# Patient Record
Sex: Male | Born: 1950 | Hispanic: No | Marital: Married | Smoking: Never smoker
Health system: Southern US, Community
[De-identification: ages and names within clinical notes are randomized; demographics above are authoritative.]

## PROBLEM LIST (undated history)

## (undated) DIAGNOSIS — C679 Malignant neoplasm of bladder, unspecified: Secondary | ICD-10-CM

## (undated) DIAGNOSIS — I10 Essential (primary) hypertension: Secondary | ICD-10-CM

## (undated) DIAGNOSIS — G4733 Obstructive sleep apnea (adult) (pediatric): Secondary | ICD-10-CM

## (undated) DIAGNOSIS — E785 Hyperlipidemia, unspecified: Secondary | ICD-10-CM

## (undated) HISTORY — DX: Essential (primary) hypertension: I10

## (undated) HISTORY — PX: BLADDER SURGERY: SHX569

## (undated) HISTORY — DX: Obstructive sleep apnea (adult) (pediatric): G47.33

## (undated) HISTORY — DX: Hyperlipidemia, unspecified: E78.5

## (undated) HISTORY — DX: Malignant neoplasm of bladder, unspecified: C67.9

---

## 2009-09-21 HISTORY — PX: COLONOSCOPY: SHX174

## 2011-11-03 ENCOUNTER — Encounter: Payer: Self-pay | Admitting: Family Medicine

## 2011-11-03 ENCOUNTER — Ambulatory Visit (INDEPENDENT_AMBULATORY_CARE_PROVIDER_SITE_OTHER): Payer: BC Managed Care – PPO | Admitting: Family Medicine

## 2011-11-03 VITALS — BP 118/72 | HR 90 | Temp 98.2°F | Ht 66.5 in | Wt 212.0 lb

## 2011-11-03 DIAGNOSIS — I10 Essential (primary) hypertension: Secondary | ICD-10-CM

## 2011-11-03 DIAGNOSIS — J45909 Unspecified asthma, uncomplicated: Secondary | ICD-10-CM

## 2011-11-03 DIAGNOSIS — C679 Malignant neoplasm of bladder, unspecified: Secondary | ICD-10-CM

## 2011-11-03 MED ORDER — ALBUTEROL SULFATE (2.5 MG/3ML) 0.083% IN NEBU: 2.5000 mg | INHALATION_SOLUTION | RESPIRATORY_TRACT | Status: DC | PRN

## 2011-11-03 MED ORDER — ALBUTEROL SULFATE HFA 108 (90 BASE) MCG/ACT IN AERS: 2.0000 | INHALATION_SPRAY | RESPIRATORY_TRACT | Status: DC | PRN

## 2011-11-03 MED ORDER — LISINOPRIL 5 MG PO TABS: 5.0000 mg | ORAL_TABLET | Freq: Every day | ORAL | Status: DC

## 2011-11-03 NOTE — Progress Notes (Signed)
  Subjective:    Patient ID: Dennis Wade, male    DOB: 1950-11-01, 61 y.o.   MRN: [id number]  HPI 61 yr old male to establish with [place]. He and his wife moved here from [place], [place] last September. He feels well with no concerns today. His asthma is well controlled, and he seldom needs to use his rescue inhaler. He does use this before exercise at times, especially in hot humid weather. He is a runner and actually completed a half marathon in [place] last year. He plans to run a full marathon this coming November. His BP is stable. He has not had a cpx for about 3 years.    Review of Systems  Constitutional: Negative.   Respiratory: Negative.   Cardiovascular: Negative.   Gastrointestinal: Negative.        Objective:   Physical Exam  Constitutional: He appears well-developed and well-nourished.  Neck: No thyromegaly present.  Cardiovascular: Normal rate, regular rhythm, normal heart sounds and intact distal pulses.   Pulmonary/Chest: Effort normal. He has no wheezes.  Musculoskeletal: He exhibits no edema.  Lymphadenopathy:    He has no cervical adenopathy.          Assessment & Plan:  Introductory visit for this man. HTN is stable. Asthma is stable. We will refer him to Urology to follow up his bladder cancer. He will set up a cpx with labs soon. He needs a colonoscopy.

## 2011-11-10 ENCOUNTER — Telehealth: Payer: Self-pay | Admitting: Family Medicine

## 2011-11-10 NOTE — Telephone Encounter (Signed)
Patient called stating that he would like to have some patches that he puts behind his ear called into [facility] on [address] as he is going on a cruise. Please advise and inform patient.

## 2011-11-10 NOTE — Telephone Encounter (Signed)
Call in Scopolamine transderm patches, apply every 3 days, #10

## 2011-11-11 MED ORDER — SCOPOLAMINE 1 MG/3DAYS TD PT72: 1.0000 | MEDICATED_PATCH | TRANSDERMAL | Status: AC

## 2011-11-11 NOTE — Telephone Encounter (Signed)
Script sent e-scribe and left voice message for pt. 

## 2011-12-08 ENCOUNTER — Other Ambulatory Visit: Payer: BC Managed Care – PPO

## 2011-12-15 ENCOUNTER — Encounter: Payer: BC Managed Care – PPO | Admitting: Family Medicine

## 2011-12-29 ENCOUNTER — Other Ambulatory Visit (INDEPENDENT_AMBULATORY_CARE_PROVIDER_SITE_OTHER): Payer: BC Managed Care – PPO

## 2011-12-29 DIAGNOSIS — Z0000 Encounter for general adult medical examination without abnormal findings: Secondary | ICD-10-CM

## 2011-12-29 LAB — BASIC METABOLIC PANEL
Chloride: 104 mEq/L (ref 96–112)
Glucose, Bld: 96 mg/dL (ref 70–99)
Potassium: 4.7 mEq/L (ref 3.5–5.1)
Sodium: 140 mEq/L (ref 135–145)

## 2011-12-29 LAB — HEPATIC FUNCTION PANEL
ALT: 27 U/L (ref 0–53)
AST: 26 U/L (ref 0–37)
Total Protein: 6.8 g/dL (ref 6.0–8.3)

## 2011-12-29 LAB — CBC WITH DIFFERENTIAL/PLATELET
Basophils Relative: 0.3 % (ref 0.0–3.0)
Eosinophils Relative: 1.5 % (ref 0.0–5.0)
HCT: 42.6 % (ref 39.0–52.0)
Hemoglobin: 14.4 g/dL (ref 13.0–17.0)
Lymphs Abs: 2.6 10*3/uL (ref 0.7–4.0)
MCV: 91.2 fl (ref 78.0–100.0)
Monocytes Absolute: 0.6 10*3/uL (ref 0.1–1.0)
RBC: 4.67 Mil/uL (ref 4.22–5.81)
WBC: 7 10*3/uL (ref 4.5–10.5)

## 2011-12-29 LAB — POCT URINALYSIS DIPSTICK
Bilirubin, UA: NEGATIVE
Glucose, UA: NEGATIVE
Ketones, UA: NEGATIVE
Nitrite, UA: NEGATIVE
Spec Grav, UA: 1.025
pH, UA: 6

## 2011-12-29 LAB — LIPID PANEL
Cholesterol: 214 mg/dL — ABNORMAL HIGH (ref 0–200)
Triglycerides: 92 mg/dL (ref 0.0–149.0)

## 2011-12-29 LAB — PSA: PSA: 0.46 ng/mL (ref 0.10–4.00)

## 2011-12-29 LAB — TSH: TSH: 1.46 u[IU]/mL (ref 0.35–5.50)

## 2012-01-04 NOTE — Progress Notes (Signed)
Quick Note:  Spoke with pt ______ 

## 2012-01-05 ENCOUNTER — Encounter: Payer: Self-pay | Admitting: Family Medicine

## 2012-01-05 ENCOUNTER — Ambulatory Visit (INDEPENDENT_AMBULATORY_CARE_PROVIDER_SITE_OTHER): Payer: BC Managed Care – PPO | Admitting: Family Medicine

## 2012-01-05 VITALS — BP 118/80 | HR 74 | Temp 98.5°F | Ht 66.0 in | Wt 214.0 lb

## 2012-01-05 DIAGNOSIS — R9431 Abnormal electrocardiogram [ECG] [EKG]: Secondary | ICD-10-CM

## 2012-01-05 DIAGNOSIS — Z0000 Encounter for general adult medical examination without abnormal findings: Secondary | ICD-10-CM

## 2012-01-05 MED ORDER — ROSUVASTATIN CALCIUM 20 MG PO TABS: 20.0000 mg | ORAL_TABLET | Freq: Every day | ORAL | Status: DC

## 2012-01-05 NOTE — Progress Notes (Signed)
  Subjective:    Patient ID: Dennis Wade, male    DOB: [birth year], 61 y.o.   MRN: [id number]  HPI 61 yr old male for a cpx. He feels fine and has no concerns. He saw Dr. Ihor Gully yesterday for his hx of bladder cancer, and he had a normal evaluation. His scrotal exam and prostate exam were unremarkable. As we went over his lab results, he tells me that he had been taking Crestor 20 mg a day until he moved here, but then he stopped it. He has been off this for about 3 months.    Review of Systems  Constitutional: Negative.   HENT: Negative.   Eyes: Negative.   Respiratory: Negative.   Cardiovascular: Negative.   Gastrointestinal: Negative.   Genitourinary: Negative.   Musculoskeletal: Negative.   Skin: Negative.   Neurological: Negative.   Hematological: Negative.   Psychiatric/Behavioral: Negative.        Objective:   Physical Exam  Constitutional: He is oriented to person, place, and time. He appears well-developed and well-nourished. No distress.  HENT:  Head: Normocephalic and atraumatic.  Right Ear: External ear normal.  Left Ear: External ear normal.  Nose: Nose normal.  Mouth/Throat: Oropharynx is clear and moist. No oropharyngeal exudate.  Eyes: Conjunctivae and EOM are normal. Pupils are equal, round, and reactive to light. Right eye exhibits no discharge. Left eye exhibits no discharge. No scleral icterus.  Neck: Neck supple. No JVD present. No tracheal deviation present. No thyromegaly present.  Cardiovascular: Normal rate, regular rhythm, normal heart sounds and intact distal pulses.  Exam reveals no gallop and no friction rub.   No murmur heard.      EKG shows RBBB with possible RV hypertrophy (of unknown duration)   Pulmonary/Chest: Effort normal and breath sounds normal. No respiratory distress. He has no wheezes. He has no rales. He exhibits no tenderness.  Abdominal: Soft. Bowel sounds are normal. He exhibits no distension and no mass. There is no tenderness.  There is no rebound and no guarding.  Musculoskeletal: Normal range of motion. He exhibits no edema and no tenderness.  Lymphadenopathy:    He has no cervical adenopathy.  Neurological: He is alert and oriented to person, place, and time. He has normal reflexes. No cranial nerve deficit. He exhibits normal muscle tone. Coordination normal.  Skin: Skin is warm and dry. No rash noted. He is not diaphoretic. No erythema. No pallor.  Psychiatric: He has a normal mood and affect. His behavior is normal. Judgment and thought content normal.          Assessment & Plan:  Well exam. We will set up an ECHO soon to further evaluate the RVH and RBBB on his EKG. Set up a colonoscopy. Get back on Crestor like before and recheck labs in 6 months

## 2012-01-07 ENCOUNTER — Encounter: Payer: Self-pay | Admitting: Gastroenterology

## 2012-01-14 ENCOUNTER — Ambulatory Visit (HOSPITAL_COMMUNITY): Payer: BC Managed Care – PPO | Attending: Cardiovascular Disease

## 2012-01-14 ENCOUNTER — Other Ambulatory Visit: Payer: Self-pay

## 2012-01-14 DIAGNOSIS — R9431 Abnormal electrocardiogram [ECG] [EKG]: Secondary | ICD-10-CM | POA: Insufficient documentation

## 2012-01-14 DIAGNOSIS — I10 Essential (primary) hypertension: Secondary | ICD-10-CM | POA: Insufficient documentation

## 2012-01-14 DIAGNOSIS — Z87891 Personal history of nicotine dependence: Secondary | ICD-10-CM | POA: Insufficient documentation

## 2012-01-14 DIAGNOSIS — E785 Hyperlipidemia, unspecified: Secondary | ICD-10-CM | POA: Insufficient documentation

## 2012-01-14 DIAGNOSIS — I517 Cardiomegaly: Secondary | ICD-10-CM | POA: Insufficient documentation

## 2012-01-18 NOTE — Progress Notes (Signed)
Quick Note:  I tried to reach pt by phone, no answer and I put a copy of results in mail. ______

## 2012-01-25 ENCOUNTER — Ambulatory Visit (AMBULATORY_SURGERY_CENTER): Payer: BC Managed Care – PPO | Admitting: *Deleted

## 2012-01-25 VITALS — Ht 66.0 in | Wt 215.2 lb

## 2012-01-25 DIAGNOSIS — Z1211 Encounter for screening for malignant neoplasm of colon: Secondary | ICD-10-CM

## 2012-01-25 MED ORDER — PEG-KCL-NACL-NASULF-NA ASC-C 100 G PO SOLR: ORAL | Status: DC

## 2012-01-26 ENCOUNTER — Encounter: Payer: Self-pay | Admitting: Gastroenterology

## 2012-02-08 ENCOUNTER — Ambulatory Visit (AMBULATORY_SURGERY_CENTER): Payer: BC Managed Care – PPO | Admitting: Gastroenterology

## 2012-02-08 ENCOUNTER — Encounter: Payer: Self-pay | Admitting: Gastroenterology

## 2012-02-08 VITALS — BP 112/66 | HR 62 | Temp 96.2°F | Resp 16 | Ht 66.0 in | Wt 215.0 lb

## 2012-02-08 DIAGNOSIS — Z1211 Encounter for screening for malignant neoplasm of colon: Secondary | ICD-10-CM

## 2012-02-08 DIAGNOSIS — D126 Benign neoplasm of colon, unspecified: Secondary | ICD-10-CM

## 2012-02-08 MED ORDER — SODIUM CHLORIDE 0.9 % IV SOLN: 500.0000 mL | INTRAVENOUS | Status: DC

## 2012-02-08 NOTE — Op Note (Signed)
[facility] [address] [place], [place]  [address]  COLONOSCOPY PROCEDURE REPORT  PATIENT:  Dennis Wade, Dennis Wade  MR#:  [id number] BIRTHDATE:  02/24/1951, 61 yrs. old  GENDER:  male ENDOSCOPIST:  Rachael Fee, MD REF. BY:  Tera Mater. Clent Ridges, M.D. PROCEDURE DATE:  02/08/2012 PROCEDURE:  Colonoscopy with snare polypectomy ASA CLASS:  Class II INDICATIONS:  Routine Risk Screening MEDICATIONS:   Fentanyl 50 mcg IV, These medications were titrated to patient response per physician's verbal order, Versed 6 mg IV  DESCRIPTION OF PROCEDURE:   After the risks benefits and alternatives of the procedure were thoroughly explained, informed consent was obtained.  Digital rectal exam was performed and revealed no rectal masses.   The LB CF-[id number] endoscope was introduced through the anus and advanced to the cecum, which was identified by both the appendix and ileocecal valve, without limitations.  The quality of the prep was good..  The instrument was then slowly withdrawn as the colon was fully examined. <<PROCEDUREIMAGES>> FINDINGS:  Three sessile polyps were found, removed with cold snare and sent to pathology (jar 1). These were in ascending colon, 3-61mm across (see image3).  This was otherwise a normal examination of the colon (see image1, image2, and image4). Retroflexed views in the rectum revealed no abnormalities. COMPLICATIONS:  None  ENDOSCOPIC IMPRESSION: 1) Three small polyps, all were removed and all were sent to pathology 2) Otherwise normal examination  RECOMMENDATIONS: 1) If the polyp(s) removed today are proven to be adenomatous (pre-cancerous) polyps, you will need a colonoscopy in 3-5 years. Otherwise you should continue to follow colorectal cancer screening guidelines for "routine risk" patients with a colonoscopy in 10 years. You will receive a letter within 1-2 weeks with the results of your biopsy as well as final recommendations. Please call my  office if you have not received a letter after 3 weeks.  ______________________________ Rachael Fee, MD  n. eSIGNED:   Rachael Fee at 02/08/2012 02:22 PM  Lona Kettle, [id number]

## 2012-02-08 NOTE — Progress Notes (Signed)
Patient did not experience any of the following events: a burn prior to discharge; a fall within the facility; wrong site/side/patient/procedure/implant event; or a hospital transfer or hospital admission upon discharge from the facility. (G8907) Patient did not have preoperative order for IV antibiotic SSI prophylaxis. (G8918)  

## 2012-02-08 NOTE — Patient Instructions (Signed)
YOU HAD AN ENDOSCOPIC PROCEDURE TODAY AT THE [facility]: Refer to the procedure report that was given to you for any specific questions about what was found during the examination.  If the procedure report does not answer your questions, please call your gastroenterologist to clarify.  If you requested that your care partner not be given the details of your procedure findings, then the procedure report has been included in a sealed envelope for you to review at your convenience later.  YOU SHOULD EXPECT: Some feelings of bloating in the abdomen. Passage of more gas than usual.  Walking can help get rid of the air that was put into your GI tract during the procedure and reduce the bloating. If you had a lower endoscopy (such as a colonoscopy or flexible sigmoidoscopy) you may notice spotting of blood in your stool or on the toilet paper. If you underwent a bowel prep for your procedure, then you may not have a normal bowel movement for a few days.  DIET: Your first meal following the procedure should be a light meal and then it is ok to progress to your normal diet.  A half-sandwich or bowl of soup is an example of a good first meal.  Heavy or fried foods are harder to digest and may make you feel nauseous or bloated.  Likewise meals heavy in dairy and vegetables can cause extra gas to form and this can also increase the bloating.  Drink plenty of fluids but you should avoid alcoholic beverages for 24 hours.  ACTIVITY: Your care partner should take you home directly after the procedure.  You should plan to take it easy, moving slowly for the rest of the day.  You can resume normal activity the day after the procedure however you should NOT DRIVE or use heavy machinery for 24 hours (because of the sedation medicines used during the test).    SYMPTOMS TO REPORT IMMEDIATELY: A gastroenterologist can be reached at any hour.  During normal business hours, 8:30 AM to 5:00 PM Monday through Friday,  call [phone].  After hours and on weekends, please call the GI answering service at [phone] who will take a message and have the physician on call contact you.   Following lower endoscopy (colonoscopy or flexible sigmoidoscopy):  Excessive amounts of blood in the stool  Significant tenderness or worsening of abdominal pains  Swelling of the abdomen that is new, acute  Fever of 100F or higher    FOLLOW UP: If any biopsies were taken you will be contacted by phone or by letter within the next 1-3 weeks.  Call your gastroenterologist if you have not heard about the biopsies in 3 weeks.  Our staff will call the home number listed on your records the next business day following your procedure to check on you and address any questions or concerns that you may have at that time regarding the information given to you following your procedure. This is a courtesy call and so if there is no answer at the home number and we have not heard from you through the emergency physician on call, we will assume that you have returned to your regular daily activities without incident.  SIGNATURES/CONFIDENTIALITY: You and/or your care partner have signed paperwork which will be entered into your electronic medical record.  These signatures attest to the fact that that the information above on your After Visit Summary has been reviewed and is understood.  Full responsibility of the confidentiality   of this discharge information lies with you and/or your care-partner.     

## 2012-02-09 ENCOUNTER — Telehealth: Payer: Self-pay | Admitting: *Deleted

## 2012-02-09 NOTE — Telephone Encounter (Signed)
  Follow up Call-  Call back number 02/08/2012  Post procedure Call Back phone  # [phone]  Permission to leave phone message Yes     Patient questions:  Do you have a fever, pain , or abdominal swelling? no Pain Score  0 *  Have you tolerated food without any problems? yes  Have you been able to return to your normal activities? yes  Do you have any questions about your discharge instructions: Diet   no Medications  no Follow up visit  no  Do you have questions or concerns about your Care? no  Actions: * If pain score is 4 or above: No action needed, pain <4.

## 2012-02-17 ENCOUNTER — Encounter: Payer: Self-pay | Admitting: Gastroenterology

## 2012-02-19 NOTE — Progress Notes (Signed)
Addended by: Maple Hudson on: 02/19/2012 07:24 AM   Modules accepted: Level of Service

## 2012-12-28 ENCOUNTER — Telehealth: Payer: Self-pay | Admitting: Family Medicine

## 2012-12-28 NOTE — Telephone Encounter (Signed)
Refill request from [facility] for Lisinopril 5 mg & Crestor 20 mg ( 90 day supply ). Can we refill these?

## 2012-12-28 NOTE — Telephone Encounter (Signed)
Okay to fill both for one year

## 2013-01-05 ENCOUNTER — Telehealth: Payer: Self-pay | Admitting: Family Medicine

## 2013-01-05 MED ORDER — LISINOPRIL 5 MG PO TABS: 5.0000 mg | ORAL_TABLET | Freq: Every day | ORAL | Status: DC

## 2013-01-05 NOTE — Telephone Encounter (Signed)
Pt needs new rx lisinopril 5 mg #10 call into [facility] on [address] [phone]. Pt is waiting on mailorde. Thanks rachel

## 2013-02-24 ENCOUNTER — Telehealth: Payer: Self-pay | Admitting: Family Medicine

## 2013-02-24 ENCOUNTER — Emergency Department (HOSPITAL_COMMUNITY)
Admission: EM | Admit: 2013-02-24 | Discharge: 2013-02-24 | Disposition: A | Discharge status: Home / Self Care | Payer: BC Managed Care – PPO | Attending: Emergency Medicine | Admitting: Emergency Medicine

## 2013-02-24 ENCOUNTER — Encounter (HOSPITAL_COMMUNITY): Payer: Self-pay | Admitting: Emergency Medicine

## 2013-02-24 DIAGNOSIS — S335XXA Sprain of ligaments of lumbar spine, initial encounter: Secondary | ICD-10-CM | POA: Insufficient documentation

## 2013-02-24 DIAGNOSIS — E785 Hyperlipidemia, unspecified: Secondary | ICD-10-CM | POA: Insufficient documentation

## 2013-02-24 DIAGNOSIS — Y9289 Other specified places as the place of occurrence of the external cause: Secondary | ICD-10-CM | POA: Insufficient documentation

## 2013-02-24 DIAGNOSIS — Z79899 Other long term (current) drug therapy: Secondary | ICD-10-CM | POA: Insufficient documentation

## 2013-02-24 DIAGNOSIS — S161XXA Strain of muscle, fascia and tendon at neck level, initial encounter: Secondary | ICD-10-CM

## 2013-02-24 DIAGNOSIS — J45909 Unspecified asthma, uncomplicated: Secondary | ICD-10-CM | POA: Insufficient documentation

## 2013-02-24 DIAGNOSIS — Z8551 Personal history of malignant neoplasm of bladder: Secondary | ICD-10-CM | POA: Insufficient documentation

## 2013-02-24 DIAGNOSIS — S139XXA Sprain of joints and ligaments of unspecified parts of neck, initial encounter: Secondary | ICD-10-CM | POA: Insufficient documentation

## 2013-02-24 DIAGNOSIS — R252 Cramp and spasm: Secondary | ICD-10-CM

## 2013-02-24 DIAGNOSIS — S39012A Strain of muscle, fascia and tendon of lower back, initial encounter: Secondary | ICD-10-CM

## 2013-02-24 DIAGNOSIS — Y9389 Activity, other specified: Secondary | ICD-10-CM | POA: Insufficient documentation

## 2013-02-24 DIAGNOSIS — I10 Essential (primary) hypertension: Secondary | ICD-10-CM | POA: Insufficient documentation

## 2013-02-24 MED ORDER — HYDROCODONE-ACETAMINOPHEN 5-325 MG PO TABS: 2.0000 | ORAL_TABLET | Freq: Four times a day (QID) | ORAL | Status: DC | PRN

## 2013-02-24 MED ORDER — IBUPROFEN 800 MG PO TABS: 800.0000 mg | ORAL_TABLET | Freq: Three times a day (TID) | ORAL | Status: DC

## 2013-02-24 MED ORDER — METHOCARBAMOL 500 MG PO TABS: 500.0000 mg | ORAL_TABLET | Freq: Two times a day (BID) | ORAL | Status: DC
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
## 2013-02-25 NOTE — ED Provider Notes (Signed)
Medical screening examination/treatment/procedure(s) were performed by non-physician practitioner and as supervising physician I was immediately available for consultation/collaboration.    Milka Windholz L Monique Gift, MD 02/25/13 1541 

## 2013-03-06 ENCOUNTER — Telehealth: Payer: Self-pay | Admitting: Family Medicine

## 2013-03-06 MED ORDER — ROSUVASTATIN CALCIUM 20 MG PO TABS: 20.0000 mg | ORAL_TABLET | ORAL | Status: DC

## 2013-03-06 NOTE — Telephone Encounter (Signed)
Pt needs new RX for rosuvastatin (CRESTOR) 20 MG tablet. 90 supply. One PO daily. Pharm: [facility]  (*please note new mail order pharm) Pt is out of meds and would like a 10 day / 2 week supply sent to [facility].

## 2013-03-06 NOTE — Telephone Encounter (Signed)
Please send in both of these

## 2013-03-06 NOTE — Telephone Encounter (Signed)
I sent script e-scribe and called in a 30 day supply to local pharmacy.

## 2013-03-06 NOTE — Telephone Encounter (Signed)
Can we refill this? 

## 2013-05-08 ENCOUNTER — Other Ambulatory Visit (INDEPENDENT_AMBULATORY_CARE_PROVIDER_SITE_OTHER): Payer: BC Managed Care – PPO

## 2013-05-08 DIAGNOSIS — Z0000 Encounter for general adult medical examination without abnormal findings: Secondary | ICD-10-CM

## 2013-05-08 LAB — POCT URINALYSIS DIPSTICK
Bilirubin, UA: NEGATIVE
Blood, UA: NEGATIVE
Glucose, UA: NEGATIVE
Ketones, UA: NEGATIVE
Spec Grav, UA: 1.02
Urobilinogen, UA: 0.2

## 2013-05-08 LAB — BASIC METABOLIC PANEL
BUN: 17 mg/dL (ref 6–23)
Calcium: 8.8 mg/dL (ref 8.4–10.5)
GFR: 97.65 mL/min (ref >60.00)
Glucose, Bld: 93 mg/dL (ref 70–99)
Potassium: 4.4 mEq/L (ref 3.5–5.1)
Sodium: 137 mEq/L (ref 135–145)

## 2013-05-08 LAB — CBC WITH DIFFERENTIAL/PLATELET
Basophils Absolute: 0 10*3/uL (ref 0.0–0.1)
Eosinophils Relative: 1.5 % (ref 0.0–5.0)
HCT: 42 % (ref 39.0–52.0)
Hemoglobin: 14.4 g/dL (ref 13.0–17.0)
Lymphocytes Relative: 34.1 % (ref 12.0–46.0)
Monocytes Relative: 6 % (ref 3.0–12.0)
Platelets: 246 10*3/uL (ref 150.0–400.0)
RDW: 13.3 % (ref 11.5–14.6)
WBC: 8.4 10*3/uL (ref 4.5–10.5)

## 2013-05-08 LAB — LIPID PANEL
Cholesterol: 126 mg/dL (ref 0–200)
HDL: 36.1 mg/dL — ABNORMAL LOW (ref >39.00)
LDL Cholesterol: 72 mg/dL (ref 0–99)
Triglycerides: 90 mg/dL (ref 0.0–149.0)
VLDL: 18 mg/dL (ref 0.0–40.0)

## 2013-05-08 LAB — HEPATIC FUNCTION PANEL
ALT: 28 U/L (ref 0–53)
AST: 27 U/L (ref 0–37)
Alkaline Phosphatase: 41 U/L (ref 39–117)
Total Bilirubin: 1.1 mg/dL (ref 0.3–1.2)

## 2013-05-16 ENCOUNTER — Ambulatory Visit (INDEPENDENT_AMBULATORY_CARE_PROVIDER_SITE_OTHER): Payer: BC Managed Care – PPO | Admitting: Family Medicine

## 2013-05-16 ENCOUNTER — Encounter: Payer: Self-pay | Admitting: Family Medicine

## 2013-05-16 VITALS — BP 120/70 | HR 64 | Temp 98.3°F | Ht 66.25 in | Wt 212.0 lb

## 2013-05-16 DIAGNOSIS — Z0000 Encounter for general adult medical examination without abnormal findings: Secondary | ICD-10-CM

## 2013-05-16 MED ORDER — ROSUVASTATIN CALCIUM 20 MG PO TABS: 20.0000 mg | ORAL_TABLET | ORAL | Status: DC

## 2013-05-16 MED ORDER — LISINOPRIL 5 MG PO TABS: 5.0000 mg | ORAL_TABLET | Freq: Every day | ORAL | Status: DC

## 2013-05-16 MED ORDER — ACRIVASTINE-PSEUDOEPHEDRINE 8-60 MG PO CAPS: 1.0000 | ORAL_CAPSULE | Freq: Two times a day (BID) | ORAL | Status: DC | PRN

## 2013-05-16 NOTE — Progress Notes (Signed)
  Subjective:    Patient ID: Dennis Wade, male    DOB: March 28, 1951, 62 y.o.   MRN: [id number]  HPI 62 yr old male for a cpx.  He feels well.    Review of Systems  Constitutional: Negative.   HENT: Negative.   Eyes: Negative.   Respiratory: Negative.   Cardiovascular: Negative.   Gastrointestinal: Negative.   Genitourinary: Negative.   Musculoskeletal: Negative.   Skin: Negative.   Neurological: Negative.   Psychiatric/Behavioral: Negative.        Objective:   Physical Exam  Constitutional: He is oriented to person, place, and time. He appears well-developed and well-nourished. No distress.  HENT:  Head: Normocephalic and atraumatic.  Right Ear: External ear normal.  Left Ear: External ear normal.  Nose: Nose normal.  Mouth/Throat: Oropharynx is clear and moist. No oropharyngeal exudate.  Eyes: Conjunctivae and EOM are normal. Pupils are equal, round, and reactive to light. Right eye exhibits no discharge. Left eye exhibits no discharge. No scleral icterus.  Neck: Neck supple. No JVD present. No tracheal deviation present. No thyromegaly present.  Cardiovascular: Normal rate, regular rhythm, normal heart sounds and intact distal pulses.  Exam reveals no gallop and no friction rub.   No murmur heard. EKG shows stable RBBB  Pulmonary/Chest: Effort normal and breath sounds normal. No respiratory distress. He has no wheezes. He has no rales. He exhibits no tenderness.  Abdominal: Soft. Bowel sounds are normal. He exhibits no distension and no mass. There is no tenderness. There is no rebound and no guarding.  Genitourinary: Rectum normal, prostate normal and penis normal. Guaiac negative stool. No penile tenderness.  Musculoskeletal: Normal range of motion. He exhibits no edema and no tenderness.  Lymphadenopathy:    He has no cervical adenopathy.  Neurological: He is alert and oriented to person, place, and time. He has normal reflexes. No cranial nerve deficit. He exhibits  normal muscle tone. Coordination normal.  Skin: Skin is warm and dry. No rash noted. He is not diaphoretic. No erythema. No pallor.  Psychiatric: He has a normal mood and affect. His behavior is normal. Judgment and thought content normal.          Assessment & Plan:  Well exam.

## 2013-08-09 ENCOUNTER — Ambulatory Visit (INDEPENDENT_AMBULATORY_CARE_PROVIDER_SITE_OTHER): Payer: BC Managed Care – PPO

## 2013-08-09 DIAGNOSIS — Z23 Encounter for immunization: Secondary | ICD-10-CM

## 2013-09-08 ENCOUNTER — Other Ambulatory Visit (INDEPENDENT_AMBULATORY_CARE_PROVIDER_SITE_OTHER): Payer: BC Managed Care – PPO

## 2013-09-08 ENCOUNTER — Encounter: Payer: Self-pay | Admitting: Family Medicine

## 2013-09-08 ENCOUNTER — Ambulatory Visit (INDEPENDENT_AMBULATORY_CARE_PROVIDER_SITE_OTHER): Payer: BC Managed Care – PPO | Admitting: Family Medicine

## 2013-09-08 VITALS — BP 126/80 | HR 74

## 2013-09-08 DIAGNOSIS — M79609 Pain in unspecified limb: Secondary | ICD-10-CM

## 2013-09-08 DIAGNOSIS — S76119A Strain of unspecified quadriceps muscle, fascia and tendon, initial encounter: Secondary | ICD-10-CM | POA: Insufficient documentation

## 2013-09-08 DIAGNOSIS — M79651 Pain in right thigh: Secondary | ICD-10-CM

## 2013-09-08 DIAGNOSIS — IMO0002 Reserved for concepts with insufficient information to code with codable children: Secondary | ICD-10-CM

## 2013-09-08 DIAGNOSIS — S76111A Strain of right quadriceps muscle, fascia and tendon, initial encounter: Secondary | ICD-10-CM

## 2013-09-08 MED ORDER — MELOXICAM 15 MG PO TABS: 15.0000 mg | ORAL_TABLET | Freq: Every day | ORAL | Status: DC

## 2013-09-08 NOTE — Progress Notes (Signed)
  I'm seeing this patient by the request  of:  FRY,STEPHEN A, MD  CC: Right quad pain  HPI: Patient is a very pleasant 62 year old gentleman who ran a marathon on November 8. Patient did train for this and was doing very well until about mild 21. Patient having severe pain in his right quadricep. Patient states unfortunately that this pain has never completely gone away since that time. Patient states that he tried to back off on his training and continue to give him trouble. Patient stopped running for a whole week and the pain did seem to respond somewhat. Patient then started trying to run in width and 1 mile of running now he continues to have this pain. Patient points to the right lateral aspect of the quadricep. Patient denies any associated back pain or knee pain. Patient denies any night pain or any a nighttime awakening secondary to pain. Patient has tried some over-the-counter medicine as well as he twitches make minimal treatment. Patient would like to stay active and wants to know if he can safely start training again. Patient rates the pain when it occurs is 8/10 but it only seems to occur now when he is running.  Past medical, surgical, family and social history reviewed. Medications reviewed all in the electronic medical record.   Review of Systems: No headache, visual changes, nausea, vomiting, diarrhea, constipation, dizziness, abdominal pain, skin rash, fevers, chills, night sweats, weight loss, swollen lymph nodes, body aches, joint swelling, muscle aches, chest pain, shortness of breath, mood changes.   Objective:    Blood pressure 126/80, pulse 74, SpO2 98.00%.   General: No apparent distress alert and oriented x3 mood and affect normal, dressed appropriately.  HEENT: Pupils equal, extraocular movements intact Respiratory: Patient's speak in full sentences and does not appear short of breath Cardiovascular: No lower extremity edema, non tender, no erythema Skin: Warm dry intact  with no signs of infection or rash on extremities or on axial skeleton. Abdomen: Soft nontender Neuro: Cranial nerves II through XII are intact, neurovascularly intact in all extremities with 2+ DTRs and 2+ pulses. Lymph: No lymphadenopathy of posterior or anterior cervical chain or axillae bilaterally.  Gait normal with good balance and coordination.  MSK: Non tender with full range of motion and good stability and symmetric strength and tone of shoulders, elbows, wrist, hip, knee and ankles bilaterally.  Patient quadricep is minimally tender to palpation over the vastus lateralis muscle proximally 6 cm from the insertion. There is no mass palpated. Patient has a negative fulcrum test. Patient does have some mild pain with resisted flexion in the groin area but has full range of motion of the hips bilaterally with internal rotation. Patient does have some mild tightness of the iliotibial band on the right side compared to the contralateral side.  Musculoskeletal ultrasound was performed and interpreted by Antoine Primas, M today. Limited ultrasound of the lateral quadricep does show that patient does have some mild scarring. At this area this is where patient seems to be tender to palpation. This appears to be a intrasubstance tear that has healed and has some mild calcifications present. This does not appear to be a solid heterotrophic ossification but could be causing discomfort.  Impression: Calcific healing of a quadricep tear  Impression and Recommendations:     This case required medical decision making of moderate complexity.

## 2013-09-08 NOTE — Progress Notes (Signed)
Pre-visit discussion using our clinic review tool. No additional management support is needed unless otherwise documented below in the visit note.  

## 2013-09-08 NOTE — Assessment & Plan Note (Signed)
Patient has what appears to be more of a quadriceps strain. Patient's history does reflect that this likely was an injury. On ultrasound today there is what appears to be scar tissue in this area that needs to remodel. Patient will try compression sleeve, meloxicam, and we did discuss nitroglycerin protocol. Patient does of side effects and what to watch out for. Patient will try these interventions and come back and see me again in 3-4 weeks. At that time if he continues to have pain differential also includes greater trochanteric bursitis with iliotibial band as well as potentially nerve impingement from axial skeleton. Will discuss this admission makes no improvement. I am optimistic though that he will do well.

## 2013-09-08 NOTE — Patient Instructions (Signed)
Great to meet you Thigh sleeve size medium. [email or web address].  Wear compression with activty and 30 minutes afterward.  Meloxicam daily for 10 days then as needed Ice 20 minutes after activity.  Exercsies most days of the week Would avoid running for next 2 weeks. Elliptical is ok.   Nitroglycerin Protocol   Apply 1/4 nitroglycerin patch to affected area daily.  Change position of patch within the affected area every 24 hours.  You may experience a headache during the first 1-2 weeks of using the patch, these should subside.  If you experience headaches after beginning nitroglycerin patch treatment, you may take your preferred over the counter pain reliever.  Another side effect of the nitroglycerin patch is skin irritation or rash related to patch adhesive.  Please notify our office if you develop more severe headaches or rash, and stop the patch.  Tendon healing with nitroglycerin patch may require 12 to 24 weeks depending on the extent of injury.  Men should not use if taking Viagra, Cialis, or Levitra.   Do not use if you have migraines or rosacea.   Come back in 3-4 weeks and we will se how you are doing  Start a walk-run progression: - I would like you to do line drills (try to keep foot on a line when jogging). - Initially start one minute walking than one minute running for 20 mins in the first week,   then 25 mins during the second week, then 30 mins afterwards.  Once you have reached 30 mins: - Run 2 mins, then walk 1 min. -Then run 3 mins, and walk 1 min. -Then run 4 mins, and walk 1 min. -Then run 5 mins, and walk 1 min. -Slowly build up weekly to running 30 mins nonstop.  If painful at any of the steps, back up one step.

## 2013-09-11 ENCOUNTER — Telehealth: Payer: Self-pay | Admitting: *Deleted

## 2013-09-11 MED ORDER — NITROGLYCERIN 0.2 MG/HR TD PT24: MEDICATED_PATCH | TRANSDERMAL | Status: DC

## 2013-09-11 NOTE — Telephone Encounter (Signed)
Pt made aware his rx was sent into the pharmacy.

## 2013-09-11 NOTE — Telephone Encounter (Signed)
Pt called states the Nitroglycerin Patch was not sent to the Pharmacy as advised.  Please advise

## 2013-10-02 ENCOUNTER — Ambulatory Visit (INDEPENDENT_AMBULATORY_CARE_PROVIDER_SITE_OTHER): Payer: BC Managed Care – PPO | Admitting: Family Medicine

## 2013-10-02 ENCOUNTER — Encounter: Payer: Self-pay | Admitting: Family Medicine

## 2013-10-02 ENCOUNTER — Other Ambulatory Visit: Payer: BC Managed Care – PPO

## 2013-10-02 VITALS — BP 136/72 | HR 78 | Temp 97.0°F | Resp 16 | Wt 227.1 lb

## 2013-10-02 DIAGNOSIS — S76111A Strain of right quadriceps muscle, fascia and tendon, initial encounter: Secondary | ICD-10-CM

## 2013-10-02 DIAGNOSIS — IMO0002 Reserved for concepts with insufficient information to code with codable children: Secondary | ICD-10-CM

## 2013-10-02 NOTE — Patient Instructions (Signed)
Good to see you Start with 15 second wall sits and add 5 seconds a week. Aim is 3 sets most days of the week.  Straight leg iso lift with 4 second slow down.  Continue running progression Continue the patch and the compression Pills only when you need them.  Ice for 10-15 minutes after exercises  Come back again in 4 weeks.

## 2013-10-02 NOTE — Assessment & Plan Note (Signed)
Patient is doing very well at this time. Patient given other home exercises that can be described at the patient instructions. Continue the patch and abducted icing regimen after activity. Patient will come back again in 4 weeks. As long as he is doing well at this time we'll get him to start a titrate down off the patch and increase his activity safely.

## 2013-10-02 NOTE — Progress Notes (Signed)
  CC: Right quad pain  HPI: Patient is a very pleasant 63 year old gentleman who ran a marathon on November 8 him following up for a quadriceps strain. Patient did have a calcific changes in the quadricep. Patient was told to do compression sleeve, given home exercise program, as well as a running protocol. Patient also started to nitroglycerin patches. Patient states he is approximately 70% better. Patient states that he is doing her retrogression and is up to 27 minutes. Patient has some mild tightness from time to time but no pain. Patient has been wearing the patch without any significant side effects. Patient states the compression does feel good. Denies any new symptoms. Patient is happy with the results so far.  Past medical, surgical, family and social history reviewed. Medications reviewed all in the electronic medical record.   Review of Systems: No headache, visual changes, nausea, vomiting, diarrhea, constipation, dizziness, abdominal pain, skin rash, fevers, chills, night sweats, weight loss, swollen lymph nodes, body aches, joint swelling, muscle aches, chest pain, shortness of breath, mood changes.   Objective:    Blood pressure 136/72, pulse 78, temperature 97 F (36.1 C), temperature source Oral, resp. rate 16, weight 227 lb 1.9 oz (103.021 kg), SpO2 98.00%.   General: No apparent distress alert and oriented x3 mood and affect normal, dressed appropriately.  HEENT: Pupils equal, extraocular movements intact Respiratory: Patient's speak in full sentences and does not appear short of breath Cardiovascular: No lower extremity edema, non tender, no erythema Skin: Warm dry intact with no signs of infection or rash on extremities or on axial skeleton. Abdomen: Soft nontender Neuro: Cranial nerves II through XII are intact, neurovascularly intact in all extremities with 2+ DTRs and 2+ pulses. Lymph: No lymphadenopathy of posterior or anterior cervical chain or axillae bilaterally.   Gait normal with good balance and coordination.  MSK: Non tender with full range of motion and good stability and symmetric strength and tone of shoulders, elbows, wrist, hip, knee and ankles bilaterally.  Patient quadricep is nontender There is no mass palpated. Patient has a negative fulcrum test. Patient has full strength of the quadriceps compared to the contralateral side. Neurovascularly intact distally the  Musculoskeletal ultrasound was performed and interpreted by Hulan Saas, M today. Limited ultrasound of the lateral quadricep does show that complete regeneration of muscle but still very small amount of calcium left. Patient's calcium the left measures 0.49 cm compared to 2.9 cm previously.  Impression: Near complete healed of quadricep tear with resorption of calcific ossification.  Impression and Recommendations:     This case required medical decision making of moderate complexity.

## 2013-10-30 ENCOUNTER — Ambulatory Visit (INDEPENDENT_AMBULATORY_CARE_PROVIDER_SITE_OTHER): Payer: BC Managed Care – PPO | Admitting: Family Medicine

## 2013-10-30 ENCOUNTER — Encounter: Payer: Self-pay | Admitting: Family Medicine

## 2013-10-30 ENCOUNTER — Other Ambulatory Visit: Payer: BC Managed Care – PPO

## 2013-10-30 VITALS — BP 128/74 | HR 89 | Temp 97.4°F | Resp 16 | Wt 225.1 lb

## 2013-10-30 DIAGNOSIS — S76119A Strain of unspecified quadriceps muscle, fascia and tendon, initial encounter: Secondary | ICD-10-CM

## 2013-10-30 DIAGNOSIS — IMO0002 Reserved for concepts with insufficient information to code with codable children: Secondary | ICD-10-CM

## 2013-10-30 NOTE — Progress Notes (Signed)
Pre-visit discussion using our clinic review tool. No additional management support is needed unless otherwise documented below in the visit note.  

## 2013-10-30 NOTE — Patient Instructions (Signed)
Good to see you Continue the compression 4:1 ratio of carbs to protein after exercise example is chocolate milk.  You are free for any running.  Start to decrease the nitro down to every other day for next 2 weeks and then try to stop.  See me when you need me.

## 2013-10-30 NOTE — Assessment & Plan Note (Signed)
Patient has completely healed at this time. Patient is fine to start progressing further. Please see patient instructions for further details. We discussed proper nutrition for replacement after exercises that could be beneficial. Patient will come back again on an as-needed basis.

## 2013-10-30 NOTE — Progress Notes (Signed)
  CC: Right quad pain follow up.   HPI: Patient is a very pleasant 63 year old gentleman who ran a marathon on November 8 here following up for a quadriceps strain. Patient did have a calcific changes in the quadricep that was decreasing in size at last visit. Patient has continued the compression sleeve, surgical history patch, and continued to progress with running. Patient denies any pain whatsoever. Patient states that he stands for a little that time he did have some discomfort. Overall though he is doing very well. Patient denies any new symptoms  Past medical, surgical, family and social history reviewed. Medications reviewed all in the electronic medical record.   Review of Systems: No headache, visual changes, nausea, vomiting, diarrhea, constipation, dizziness, abdominal pain, skin rash, fevers, chills, night sweats, weight loss, swollen lymph nodes, body aches, joint swelling, muscle aches, chest pain, shortness of breath, mood changes.   Objective:    Blood pressure 128/74, pulse 89, temperature 97.4 F (36.3 C), temperature source Oral, resp. rate 16, weight 225 lb 1.3 oz (102.096 kg), SpO2 98.00%.   General: No apparent distress alert and oriented x3 mood and affect normal, dressed appropriately.  HEENT: Pupils equal, extraocular movements intact Respiratory: Patient's speak in full sentences and does not appear short of breath Cardiovascular: No lower extremity edema, non tender, no erythema Skin: Warm dry intact with no signs of infection or rash on extremities or on axial skeleton. Abdomen: Soft nontender Neuro: Cranial nerves II through XII are intact, neurovascularly intact in all extremities with 2+ DTRs and 2+ pulses. Lymph: No lymphadenopathy of posterior or anterior cervical chain or axillae bilaterally.  Gait normal with good balance and coordination.  MSK: Non tender with full range of motion and good stability and symmetric strength and tone of shoulders, elbows,  wrist, hip, knee and ankles bilaterally.  Patient quadricep is nontender There is no mass palpated. Patient has a negative fulcrum test. Patient has full strength of the quadriceps compared to the contralateral side. Neurovascularly intact distally the  Musculoskeletal ultrasound was performed and interpreted by Hulan Saas, M today. Limited ultrasound of the lateral quadricep shows no calcific findngs at this time.   Impression: fully healed quadricep.  Impression and Recommendations:     This case required medical decision making of moderate complexity.

## 2013-11-10 ENCOUNTER — Encounter: Payer: Self-pay | Admitting: Family Medicine

## 2013-11-28 ENCOUNTER — Telehealth: Payer: Self-pay | Admitting: *Deleted

## 2013-11-28 NOTE — Telephone Encounter (Signed)
Patient states that in his previous OV with Z. Tamala Julian, Dr. Tamala Julian recommended a certain nutrient to him, that he has forgotten.  Please advise.  CB# [phone]

## 2013-11-29 NOTE — Telephone Encounter (Signed)
We discussed certain after workout foods that could help including Chocolate milk 4:1 ratio of carbs to protein  Discussed Tumeric 500mg  twice daily.

## 2014-01-22 ENCOUNTER — Telehealth: Payer: Self-pay | Admitting: Family Medicine

## 2014-01-22 MED ORDER — ROSUVASTATIN CALCIUM 20 MG PO TABS: 20.0000 mg | ORAL_TABLET | ORAL | Status: DC

## 2014-01-22 NOTE — Telephone Encounter (Signed)
Pt came by early last week and gave a form to get his rosuvastatin (CRESTOR) 20 MG tablet Changed to generic. Pt would like new rx to [facility] Pt states he did not make copy of form, we have the only one.

## 2014-01-22 NOTE — Telephone Encounter (Signed)
I sent script e-scribe and requested generic if available.

## 2014-04-25 ENCOUNTER — Encounter: Payer: Self-pay | Admitting: Family Medicine

## 2014-04-25 ENCOUNTER — Ambulatory Visit (INDEPENDENT_AMBULATORY_CARE_PROVIDER_SITE_OTHER): Payer: Commercial Managed Care - HMO | Admitting: Family Medicine

## 2014-04-25 ENCOUNTER — Ambulatory Visit (INDEPENDENT_AMBULATORY_CARE_PROVIDER_SITE_OTHER)
Admission: RE | Admit: 2014-04-25 | Discharge: 2014-04-25 | Disposition: A | Discharge status: Home / Self Care | Payer: Commercial Managed Care - HMO | Source: Ambulatory Visit | Attending: Family Medicine | Admitting: Family Medicine

## 2014-04-25 VITALS — BP 110/72 | HR 72 | Ht 67.0 in | Wt 222.0 lb

## 2014-04-25 DIAGNOSIS — M79605 Pain in left leg: Secondary | ICD-10-CM

## 2014-04-25 DIAGNOSIS — M79604 Pain in right leg: Secondary | ICD-10-CM | POA: Insufficient documentation

## 2014-04-25 DIAGNOSIS — IMO0002 Reserved for concepts with insufficient information to code with codable children: Secondary | ICD-10-CM

## 2014-04-25 DIAGNOSIS — M5416 Radiculopathy, lumbar region: Secondary | ICD-10-CM

## 2014-04-25 DIAGNOSIS — M79609 Pain in unspecified limb: Secondary | ICD-10-CM

## 2014-04-25 MED ORDER — GABAPENTIN 100 MG PO CAPS: 100.0000 mg | ORAL_CAPSULE | Freq: Every day | ORAL | Status: DC

## 2014-04-25 MED ORDER — PREDNISONE 50 MG PO TABS
50.0000 mg | ORAL_TABLET | Freq: Every day | ORAL | Status: DC
Start: 2014-04-25 — End: 2014-04-26

## 2014-04-25 NOTE — Progress Notes (Signed)
  CC: Bilateral quad pain  HPI: Patient is a very pleasant 63 year old gentleman who was seen previously for quadriceps strain on the right side. Patient states that he is having discomfort in both quadriceps. Patient states he occurs when he is standing for long amount of time or if this is a certain position for a long amount of time. Patient states is not even seems to be worse both with running. Patient states though that if he does any activity he does need to stop even approximately 2 miles of walking. Patient states that his leg can feel heavy. This can be associated with back pain. Denies any fevers or chills or any abnormal weight loss. Describes the pain as more of a dull aching sensation. Not quite the same as his previous muscle pain.  Past medical, surgical, family and social history reviewed. Medications reviewed all in the electronic medical record.   Review of Systems: No headache, visual changes, nausea, vomiting, diarrhea, constipation, dizziness, abdominal pain, skin rash, fevers, chills, night sweats, weight loss, swollen lymph nodes, body aches, joint swelling, muscle aches, chest pain, shortness of breath, mood changes.   Objective:    Blood pressure 110/72, pulse 72, height 5\' 7"  (1.702 m), weight 222 lb (100.699 kg), SpO2 97.00%.   General: No apparent distress alert and oriented x3 mood and affect normal, dressed appropriately.  HEENT: Pupils equal, extraocular movements intact Respiratory: Patient's speak in full sentences and does not appear short of breath Cardiovascular: No lower extremity edema, non tender, no erythema Skin: Warm dry intact with no signs of infection or rash on extremities or on axial skeleton. Abdomen: Soft nontender Neuro: Cranial nerves II through XII are intact, neurovascularly intact in all extremities with 2+ DTRs and 2+ pulses. Lymph: No lymphadenopathy of posterior or anterior cervical chain or axillae bilaterally.  Gait normal with good  balance and coordination.  MSK: Non tender with full range of motion and good stability and symmetric strength and tone of shoulders, elbows, wrist, hip, knee and ankles bilaterally.  Patient quadriceps are nontender There is no mass palpated. Patient has a negative fulcrum test. Patient has full strength of the quadriceps compared to the contralateral side. Neurovascularly intact distally. Back Exam:  Inspection: Unremarkable  Motion: Flexion 35 deg, Extension 25 deg with pain, Side Bending to 35 deg bilaterally,  Rotation to 45 deg bilaterally  SLR laying: Negative  XSLR laying: Negative  Palpable tenderness: Moderately tender the paraspinal musculature bilaterally. FABER: Positive with pain going down the lateral aspects of the legs bilaterally to. Sensory change: Gross sensation intact to all lumbar and sacral dermatomes.  Reflexes: 2+ at both patellar tendons, 2+ at achilles tendons, Babinski's downgoing.  Strength at foot  Plantar-flexion: 5/5 Dorsi-flexion: 5/5 Eversion: 5/5 Inversion: 5/5  Leg strength  Quad: 5/5 Hamstring: 5/5 Hip flexor: 5/5 Hip abductors: 3/5  Gait unremarkable.   Musculoskeletal ultrasound was performed and interpreted by Hulan Saas, M today. Limited ultrasound of the lateral quadriceps bilaterally are negative for any type of tear.. Impression and Recommendations:     This case required medical decision making of moderate complexity.

## 2014-04-25 NOTE — Assessment & Plan Note (Addendum)
The patient is having bilateral leg pain that was worse with activity and now is giving him difficulty at rest. Patient's history is somewhat concerning for spinal stenosis. No atrophy is noted today and very minimal weakness is noted. We discussed red flags and went to see medical attention. X-rays were ordered today and are pending. We discussed an icing regimen patient was given a home exercise program for range of motion. Discussed the possibility of anti-inflammatories but we'll continue the over-the-counter medication at this time. Patient will try these interventions as well as prednisone for exacerbation for potential pinched nerve. We also discussed Pending at night. Patient will try these interventions and come back in one week for further evaluation and treatment.  Spent greater than 25 minutes with patient face-to-face and had greater than 50% of counseling including as described above in assessment and plan.  Based on patient's presentation to be more concern for an L3-L4 nerve root compression.

## 2014-04-25 NOTE — Patient Instructions (Signed)
Good to see you Congrats on retirement! Vitamin D 4000 IU daily.  Prednisone daily 5 days Gabapentin 100mg  at night.  Exercises 3 times a week.  We will get xrays today.

## 2014-04-26 ENCOUNTER — Telehealth: Payer: Self-pay | Admitting: *Deleted

## 2014-04-26 MED ORDER — GABAPENTIN 100 MG PO CAPS: 100.0000 mg | ORAL_CAPSULE | Freq: Every day | ORAL | Status: DC

## 2014-04-26 MED ORDER — PREDNISONE 50 MG PO TABS: 50.0000 mg | ORAL_TABLET | Freq: Every day | ORAL | Status: DC

## 2014-04-26 NOTE — Telephone Encounter (Signed)
Left msg on wed afternoon [facility] hasn't received medication md rx today. Called pt back [facility]. Will resend to [facility]...Dennis Wade

## 2014-05-03 ENCOUNTER — Ambulatory Visit (INDEPENDENT_AMBULATORY_CARE_PROVIDER_SITE_OTHER): Payer: Commercial Managed Care - HMO | Admitting: Family Medicine

## 2014-05-03 ENCOUNTER — Encounter: Payer: Self-pay | Admitting: Family Medicine

## 2014-05-03 VITALS — BP 124/72 | HR 74 | Ht 67.0 in | Wt 224.0 lb

## 2014-05-03 DIAGNOSIS — M79604 Pain in right leg: Secondary | ICD-10-CM

## 2014-05-03 DIAGNOSIS — M545 Low back pain, unspecified: Secondary | ICD-10-CM

## 2014-05-03 DIAGNOSIS — M79609 Pain in unspecified limb: Secondary | ICD-10-CM

## 2014-05-03 DIAGNOSIS — M79605 Pain in left leg: Principal | ICD-10-CM

## 2014-05-03 DIAGNOSIS — M549 Dorsalgia, unspecified: Secondary | ICD-10-CM | POA: Insufficient documentation

## 2014-05-03 MED ORDER — GABAPENTIN 100 MG PO CAPS
200.0000 mg | ORAL_CAPSULE | Freq: Every day | ORAL | Status: DC
Start: 2014-05-03 — End: 2014-05-24

## 2014-05-03 NOTE — Patient Instructions (Signed)
Good to see you I am glad you are doing better We will get ABI to check the blood vessels, likely will be normal.  New exercises for your back do after running Gabapentin 200mg  at night.  Running only 2-3 times a week and start with protocol.  Start a walk-run progression: - Initially start one minute walking than one minute running for 20 mins in the first week,   then 25 mins during the second week, then 30 mins afterwards.  Once you have reached 30 mins: - Run 2 mins, then walk 1 min. -Then run 3 mins, and walk 1 min. -Then run 4 mins, and walk 1 min. -Then run 5 mins, and walk 1 min. -Slowly build up weekly to running 30 mins nonstop.  If painful at any of the steps, back up one step. Come back in 3 weeks to check in.

## 2014-05-03 NOTE — Assessment & Plan Note (Signed)
Discussed with patient again at this time. I believe the patient's bilateral leg pain could still be neuropathy. Patient may have had a herniated disc that did respond well to the prednisone. We discussed home exercises that I think will be beneficial for his back. We discussed monitoring symptoms and see if there is any changes. We'll increase his gabapentin to 200 mg at night and having no side effects. I would like to rule out vascular compromise secondary to patient having difficulty with even light touch and ABIs were ordered today. Patient was given a return to running protocol and will followup in 3 weeks. Continuing to have any difficulty we may need to consider an MRI of the back.  Spent greater than 25 minutes with patient face-to-face and had greater than 50% of counseling including as described above in assessment and plan.

## 2014-05-03 NOTE — Progress Notes (Signed)
  CC: Bilateral quad pain  HPI: Patient is a very pleasant 63 year old gentleman who was seen previously for quadriceps strain.  Patient was started having the lateral quad pain. There is concern the patient would have spinal stenosis. Patient did have x-rays and lumbar spine. Patient's x-ray show that he did have degenerative disc disease at L5-S1 and mild in the rest of the lumbar region.  Patient was given prednisone and states that he did seem to help out significantly. Patient has started walking slowly again and noticed that only a mild twinge can occur. Patient still has some mild back discomfort but nothing significant. Patient is fairly happy with the results.  Patient did get more history of this problem that he can started before the initial quadricep tear that caused his visit with me. Patient states that even light sensation sometimes in his pocket and comes numbness.  Past medical, surgical, family and social history reviewed. Medications reviewed all in the electronic medical record.   Review of Systems: No headache, visual changes, nausea, vomiting, diarrhea, constipation, dizziness, abdominal pain, skin rash, fevers, chills, night sweats, weight loss, swollen lymph nodes, body aches, joint swelling, muscle aches, chest pain, shortness of breath, mood changes.   Objective:    Blood pressure 124/72, pulse 74, height 5\' 7"  (1.702 m), weight 224 lb (101.606 kg), SpO2 93.00%.   General: No apparent distress alert and oriented x3 mood and affect normal, dressed appropriately.  HEENT: Pupils equal, extraocular movements intact Respiratory: Patient's speak in full sentences and does not appear short of breath Cardiovascular: No lower extremity edema, non tender, no erythema Skin: Warm dry intact with no signs of infection or rash on extremities or on axial skeleton. Abdomen: Soft nontender Neuro: Cranial nerves II through XII are intact, neurovascularly intact in all extremities with 2+  DTRs and 2+ pulses. Lymph: No lymphadenopathy of posterior or anterior cervical chain or axillae bilaterally.  Gait normal with good balance and coordination.  MSK: Non tender with full range of motion and good stability and symmetric strength and tone of shoulders, elbows, wrist, hip, knee and ankles bilaterally.  Patient quadriceps are nontender There is no mass palpated. Patient has a negative fulcrum test. Patient has full strength of the quadriceps compared to the contralateral side. Neurovascularly intact distally. Back Exam:  Inspection: Unremarkable  Motion: Flexion 35 deg, Extension 25 deg with pain, Side Bending to 35 deg bilaterally,  Rotation to 45 deg bilaterally  SLR laying: Negative  XSLR laying: Negative  Palpable tenderness: Minimal tenderness to FABER: Negative Sensory change: Gross sensation intact to all lumbar and sacral dermatomes.  Reflexes: 2+ at both patellar tendons, 2+ at achilles tendons, Babinski's downgoing.  Strength at foot  Plantar-flexion: 5/5 Dorsi-flexion: 5/5 Eversion: 5/5 Inversion: 5/5  Leg strength  Quad: 5/5 Hamstring: 5/5 Hip flexor: 5/5 Hip abductors: 4/5  Gait unremarkable.  Impression and Recommendations:     This case required medical decision making of moderate complexity.

## 2014-05-04 ENCOUNTER — Ambulatory Visit (HOSPITAL_COMMUNITY): Payer: Medicare HMO | Attending: Cardiovascular Disease | Admitting: *Deleted

## 2014-05-04 ENCOUNTER — Other Ambulatory Visit (HOSPITAL_COMMUNITY): Payer: Self-pay

## 2014-05-04 DIAGNOSIS — M79604 Pain in right leg: Secondary | ICD-10-CM

## 2014-05-04 DIAGNOSIS — I739 Peripheral vascular disease, unspecified: Secondary | ICD-10-CM | POA: Diagnosis not present

## 2014-05-04 DIAGNOSIS — M79609 Pain in unspecified limb: Secondary | ICD-10-CM

## 2014-05-04 DIAGNOSIS — M79605 Pain in left leg: Secondary | ICD-10-CM

## 2014-05-04 NOTE — Progress Notes (Signed)
Lower Arterial Doppler Complete.

## 2014-05-24 ENCOUNTER — Encounter: Payer: Self-pay | Admitting: Family Medicine

## 2014-05-24 ENCOUNTER — Ambulatory Visit (INDEPENDENT_AMBULATORY_CARE_PROVIDER_SITE_OTHER): Payer: Commercial Managed Care - HMO | Admitting: Family Medicine

## 2014-05-24 VITALS — BP 92/72 | HR 86 | Ht 67.0 in | Wt 222.0 lb

## 2014-05-24 DIAGNOSIS — M999 Biomechanical lesion, unspecified: Secondary | ICD-10-CM

## 2014-05-24 DIAGNOSIS — M545 Low back pain, unspecified: Secondary | ICD-10-CM

## 2014-05-24 MED ORDER — GABAPENTIN 100 MG PO CAPS: 200.0000 mg | ORAL_CAPSULE | Freq: Every day | ORAL | Status: DC

## 2014-05-24 NOTE — Patient Instructions (Signed)
Have a great trip! Refilled the gabapentin.  Conitnue the exercises.  Ice is your friend See me when you need me.

## 2014-05-24 NOTE — Assessment & Plan Note (Signed)
Decision today to treat with OMT was based on Physical Exam  After verbal consent patient was treated with HVLA, ME techniques in thoracic, lumbar and sacral areas  Patient tolerated the procedure well with improvement in symptoms  Patient given exercises, stretches and lifestyle modifications  See medications in patient instructions if given  Patient will follow up in 6 weeks      

## 2014-05-24 NOTE — Progress Notes (Signed)
  CC: Bilateral quad pain  HPI: Patient is a very pleasant 63 year old gentleman who was seen previously for quadriceps strain.  Patient is having pain in both legs for quite some time. Patient's x-rays do show some degenerative disc disease at L5-S1 and mild with rest her lumbar spine. Patient did have ABI which was normal. Patient states though that over the course of time and doing home exercises his pain is significantly decreased almost completely resolved. Patient has been able to start running again on a regular basis. Some mild tightness of the left lower back but no radicular symptoms down the leg. Patient states that he is very happy with the results. Patient is leaving for 5 weeks to go to [place].   Patient did get more history of this problem that he can started before the initial quadricep tear that caused his visit with me. Patient states that even light sensation sometimes in his pocket and comes numbness.  Past medical, surgical, family and social history reviewed. Medications reviewed all in the electronic medical record.   Review of Systems: No headache, visual changes, nausea, vomiting, diarrhea, constipation, dizziness, abdominal pain, skin rash, fevers, chills, night sweats, weight loss, swollen lymph nodes, body aches, joint swelling, muscle aches, chest pain, shortness of breath, mood changes.   Objective:    Blood pressure 92/72, pulse 86, height 5\' 7"  (1.702 m), weight 222 lb (100.699 kg), SpO2 95.00%.   General: No apparent distress alert and oriented x3 mood and affect normal, dressed appropriately.  HEENT: Pupils equal, extraocular movements intact Respiratory: Patient's speak in full sentences and does not appear short of breath Cardiovascular: No lower extremity edema, non tender, no erythema Skin: Warm dry intact with no signs of infection or rash on extremities or on axial skeleton. Abdomen: Soft nontender Neuro: Cranial nerves II through XII are intact,  neurovascularly intact in all extremities with 2+ DTRs and 2+ pulses. Lymph: No lymphadenopathy of posterior or anterior cervical chain or axillae bilaterally.  Gait normal with good balance and coordination.  MSK: Non tender with full range of motion and good stability and symmetric strength and tone of shoulders, elbows, wrist, hip, knee and ankles bilaterally.  Patient quadriceps are nontender There is no mass palpated. Patient has a negative fulcrum test. Patient has full strength of the quadriceps compared to the contralateral side. Neurovascularly intact distally. Back Exam:  Inspection: Unremarkable  Motion: Flexion 35 deg, Extension 25 deg with pain, Side Bending to 35 deg bilaterally,  Rotation to 45 deg bilaterally  SLR laying: Negative  XSLR laying: Negative  Palpable tenderness: Minimal tenderness to FABER: Negative Sensory change: Gross sensation intact to all lumbar and sacral dermatomes.  Reflexes: 2+ at both patellar tendons, 2+ at achilles tendons, Babinski's downgoing.  Strength at foot  Plantar-flexion: 5/5 Dorsi-flexion: 5/5 Eversion: 5/5 Inversion: 5/5  Leg strength  Quad: 5/5 Hamstring: 5/5 Hip flexor: 5/5 Hip abductors: 4/5  Gait unremarkable.  OMT Physical Exam  Cervical  Neutral Thoracic T5 extended rotated and side bent right Lumbar L2 flexed rotated and side bent right Sacrum Left on left Illium     Impression and Recommendations:     This case required medical decision making of moderate complexity.

## 2014-05-24 NOTE — Assessment & Plan Note (Signed)
Patient is doing significantly better at this time. Patient will continue on the gabapentin 200 mg at night which seems to be doing very well. We discussed continuing the over-the-counter medications. We will not titrate up on the gabapentin. Patient did respond very well to manipulation today as well. Patient will followup again when he comes back into town in approximately 6 weeks.

## 2014-06-05 ENCOUNTER — Encounter: Payer: Self-pay | Admitting: Family Medicine

## 2014-06-05 ENCOUNTER — Ambulatory Visit (INDEPENDENT_AMBULATORY_CARE_PROVIDER_SITE_OTHER): Payer: Commercial Managed Care - HMO | Admitting: Family Medicine

## 2014-06-05 VITALS — BP 112/72 | HR 52 | Ht 67.0 in | Wt 224.0 lb

## 2014-06-05 DIAGNOSIS — M79604 Pain in right leg: Secondary | ICD-10-CM

## 2014-06-05 DIAGNOSIS — M79609 Pain in unspecified limb: Secondary | ICD-10-CM

## 2014-06-05 DIAGNOSIS — M79605 Pain in left leg: Principal | ICD-10-CM

## 2014-06-05 MED ORDER — TRAMADOL HCL 50 MG PO TABS: 50.0000 mg | ORAL_TABLET | Freq: Two times a day (BID) | ORAL | Status: DC | PRN

## 2014-06-05 MED ORDER — PREDNISONE 50 MG PO TABS: 50.0000 mg | ORAL_TABLET | Freq: Every day | ORAL | Status: DC

## 2014-06-05 NOTE — Assessment & Plan Note (Signed)
Unfortunately still believe the patient's bilateral leg pain is likely secondary to his degenerative disc disease in his back. Patient is going out of town and is not willing to have further imaging. I do feel that an MRI is going to be necessary in the long run. Patient was given prednisone for a five-day burst, tramadol for breakthrough pain and will increase patient gabapentin 300 mg at night. We discussed the icing protocol and I think will be beneficial and continued home exercises. Patient slowly start to increase his activity.  In the differential we also need to consider formal artery entrapment syndrome having it bilaterally is very rare. Patient does also have a history of a transitional bladder cancer but did not have any radiation and chemotherapy. I may need to consider watching closely. Once again the another good reason for why we need to advance imaging. Patient would like to wait until he returns to town in 5 weeks. Patient does not have any fevers or chills or any abnormal weight loss at this time. Patient states that none of the symptoms he had previously he is having at this time.  Spent greater than 25 minutes with patient face-to-face and had greater than 50% of counseling including as described above in assessment and plan.

## 2014-06-05 NOTE — Patient Instructions (Addendum)
Good to see you. Gabapentin to 300mg  at night.  Tramadol for breakthrough pain. Can take 1 tylenol with it as well.  Prednisone for 5 days if you need it. Consider B12 1022mcg or B6  200mg   Lets monitor and see how you do.  If pain worsens or not where you want to be when you get back we will get the MRI of your back.  Have agreat trip

## 2014-06-05 NOTE — Progress Notes (Signed)
  CC: Bilateral quad pain  HPI: Patient is a very pleasant 63 year old gentleman who was seen previously for quadriceps strain.  Patient is having pain in both legs for quite some time. Patient's x-rays do show some degenerative disc disease at L5-S1 and mild with rest her lumbar spine. Patient did have ABI which was normal. Patient did respond very well to conservative therapy and seem to be doing very well with a low dose gabapentin at night. Patient was seen 12 days ago at that time and was doing remarkably well. Patient states unfortunately over the course of the last week it has started to increase again. Patient is having some mild numbness in the anterior aspect of the thighs bilaterally. Patient states that he that rested in the having some mild discomfort. Patient denies any weakness. Patient states that the gabapentin at night at 200 mg nightly is not helping. Patient does not take any other medication at this time. Patient has tried some over-the-counter medicines but has not taken them a regular basis. Patient is concerned as he would like to be running on a much more frequent basis but is unable to do so secondary to this discomfort.   Past medical, surgical, family and social history reviewed. Medications reviewed all in the electronic medical record.   Review of Systems: No headache, visual changes, nausea, vomiting, diarrhea, constipation, dizziness, abdominal pain, skin rash, fevers, chills, night sweats, weight loss, swollen lymph nodes, body aches, joint swelling, muscle aches, chest pain, shortness of breath, mood changes.   Objective:    Blood pressure 112/72, pulse 52, height 5\' 7"  (1.702 m), weight 224 lb (101.606 kg), SpO2 95.00%.   General: No apparent distress alert and oriented x3 mood and affect normal, dressed appropriately.  HEENT: Pupils equal, extraocular movements intact Respiratory: Patient's speak in full sentences and does not appear short of breath Cardiovascular:  No lower extremity edema, non tender, no erythema Skin: Warm dry intact with no signs of infection or rash on extremities or on axial skeleton. Abdomen: Soft nontender Neuro: Cranial nerves II through XII are intact, neurovascularly intact in all extremities with 2+ DTRs and 2+ pulses. Lymph: No lymphadenopathy of posterior or anterior cervical chain or axillae bilaterally.  Gait normal with good balance and coordination.  MSK: Non tender with full range of motion and good stability and symmetric strength and tone of shoulders, elbows, wrist, hip, knee and ankles bilaterally.  Patient quadriceps are nontender There is no mass palpated. Patient has a negative fulcrum test. Patient has full strength of the quadriceps compared to the contralateral side. Neurovascularly intact distally. Back Exam:  Inspection: Unremarkable  Motion: Flexion 35 deg, Extension 25 deg with pain is mild to moderate, Side Bending to 35 deg bilaterally,  Rotation to 45 deg bilaterally  SLR laying: Negative  XSLR laying: Negative  Palpable tenderness: Minimal tenderness to palpation of the paraspinal musculature of the lumbar spine bilaterally. Specific finding him. FABER: Negative Sensory change: Gross sensation intact to all lumbar and sacral dermatomes.  Reflexes: 2+ at both patellar tendons, 2+ at achilles tendons, Babinski's downgoing.  Strength at foot  Plantar-flexion: 5/5 Dorsi-flexion: 5/5 Eversion: 5/5 Inversion: 5/5  Leg strength  Quad: 5/5 Hamstring: 5/5 Hip flexor: 5/5 Hip abductors: 4/5  Gait unremarkable.    Impression and Recommendations:     This case required medical decision making of moderate complexity.

## 2014-06-06 ENCOUNTER — Ambulatory Visit: Payer: Commercial Managed Care - HMO | Admitting: Family Medicine

## 2014-07-17 ENCOUNTER — Other Ambulatory Visit: Payer: Commercial Managed Care - HMO

## 2014-07-23 ENCOUNTER — Encounter: Payer: Self-pay | Admitting: Family Medicine

## 2014-07-23 ENCOUNTER — Ambulatory Visit (INDEPENDENT_AMBULATORY_CARE_PROVIDER_SITE_OTHER): Payer: Commercial Managed Care - HMO | Admitting: Family Medicine

## 2014-07-23 ENCOUNTER — Encounter: Payer: Commercial Managed Care - HMO | Admitting: Family Medicine

## 2014-07-23 VITALS — BP 115/72 | HR 61 | Temp 98.3°F | Ht 67.0 in | Wt 223.0 lb

## 2014-07-23 DIAGNOSIS — J45909 Unspecified asthma, uncomplicated: Secondary | ICD-10-CM | POA: Insufficient documentation

## 2014-07-23 DIAGNOSIS — J4520 Mild intermittent asthma, uncomplicated: Secondary | ICD-10-CM

## 2014-07-23 DIAGNOSIS — C679 Malignant neoplasm of bladder, unspecified: Secondary | ICD-10-CM | POA: Insufficient documentation

## 2014-07-23 DIAGNOSIS — Z23 Encounter for immunization: Secondary | ICD-10-CM

## 2014-07-23 MED ORDER — ALBUTEROL SULFATE HFA 108 (90 BASE) MCG/ACT IN AERS: 2.0000 | INHALATION_SPRAY | RESPIRATORY_TRACT | Status: DC | PRN

## 2014-07-23 NOTE — Progress Notes (Signed)
   Subjective:    Patient ID: Dennis Wade, male    DOB: 22-Apr-1951, 63 y.o.   MRN: [id number]  HPI Here for several things today. First he asks for his Ventolin inhaler to be refilled. Apaarently last year he saw his "old physician in [place]" for a cough and was diagnosed with asthma. He was given the inhaler and it helps. However today he describes an intermittent dry tickling cough. No wheezing or SOB. His BP has been well controlled since he has been exercising so much. He took himself off Crestor because of leg cramps. He also needs another referral to see Dr. Karsten Ro.    Review of Systems  Constitutional: Negative.   Respiratory: Positive for cough. Negative for shortness of breath and wheezing.   Cardiovascular: Negative.        Objective:   Physical Exam  Constitutional: He appears well-developed and well-nourished.  Neck: No thyromegaly present.  Cardiovascular: Normal rate, regular rhythm, normal heart sounds and intact distal pulses.   Pulmonary/Chest: Effort normal and breath sounds normal.  Lymphadenopathy:    He has no cervical adenopathy.          Assessment & Plan:  I think his cough is coming from his ACE inhibitor, so we will stop the Lisinopril. We agreed that he may not need a BP med at all, so we will simply monitor this for now. Refilled the inhaler. Referred him back to Dr. Karsten Ro.

## 2014-07-23 NOTE — Progress Notes (Signed)
Pre visit review using our clinic review tool, if applicable. No additional management support is needed unless otherwise documented below in the visit note. 

## 2014-07-24 ENCOUNTER — Telehealth: Payer: Self-pay | Admitting: Family Medicine

## 2014-07-24 NOTE — Telephone Encounter (Signed)
I spoke with pt and sent in new script for Ventolin HFA, it was hand written and faxed to [facility].

## 2014-07-24 NOTE — Telephone Encounter (Signed)
Pt requested that we fax in a new script to [facility], request must read Ventolin HFA 108 ( 90 base ). Dr. Sarajane Jews hand wrote a script for this and I faxed it to [facility], also spoke with pt.

## 2014-07-24 NOTE — Telephone Encounter (Signed)
Pt would like nurse to call him back concerning albuterol inhaler. Pt was seen on 07-23-14

## 2015-01-08 ENCOUNTER — Ambulatory Visit (INDEPENDENT_AMBULATORY_CARE_PROVIDER_SITE_OTHER): Payer: Commercial Managed Care - HMO | Admitting: Family Medicine

## 2015-01-08 ENCOUNTER — Encounter: Payer: Self-pay | Admitting: Family Medicine

## 2015-01-08 VITALS — BP 121/73 | HR 75 | Temp 98.6°F | Ht 67.0 in | Wt 222.0 lb

## 2015-01-08 DIAGNOSIS — J4541 Moderate persistent asthma with (acute) exacerbation: Secondary | ICD-10-CM

## 2015-01-08 DIAGNOSIS — J209 Acute bronchitis, unspecified: Secondary | ICD-10-CM

## 2015-01-08 MED ORDER — AZITHROMYCIN 250 MG PO TABS: ORAL_TABLET | ORAL | Status: DC

## 2015-01-08 MED ORDER — HYDROCODONE-HOMATROPINE 5-1.5 MG/5ML PO SYRP: 5.0000 mL | ORAL_SOLUTION | ORAL | Status: DC | PRN

## 2015-01-08 MED ORDER — METHYLPREDNISOLONE ACETATE 80 MG/ML IJ SUSP
120.0000 mg | Freq: Once | INTRAMUSCULAR | Status: AC
  Administered 2015-01-08: 120 mg via INTRAMUSCULAR

## 2015-01-08 NOTE — Addendum Note (Signed)
Addended by: Aggie Hacker A on: 01/08/2015 12:11 PM   Modules accepted: Orders

## 2015-01-08 NOTE — Progress Notes (Signed)
   Subjective:    Patient ID: Dennis Wade, male    DOB: Jul 16, 1951, 64 y.o.   MRN: [id number]  HPI Here for one week of chest tightness and coughing up green sputum. No fever. Using his inhaler several times a day.    Review of Systems  Constitutional: Negative.   HENT: Positive for congestion and postnasal drip. Negative for sinus pressure.   Eyes: Negative.   Respiratory: Positive for cough, chest tightness, shortness of breath and wheezing.   Cardiovascular: Negative.        Objective:   Physical Exam  Constitutional: He appears well-developed and well-nourished.  HENT:  Right Ear: External ear normal.  Left Ear: External ear normal.  Nose: Nose normal.  Mouth/Throat: Oropharynx is clear and moist.  Eyes: Conjunctivae are normal.  Cardiovascular: Normal rate, regular rhythm, normal heart sounds and intact distal pulses.   Pulmonary/Chest: Effort normal. No respiratory distress. He has no rales.  Scattered rhonchi and wheezes   Lymphadenopathy:    He has no cervical adenopathy.          Assessment & Plan:  Given a Zpack and a steroid shot.

## 2015-03-27 ENCOUNTER — Other Ambulatory Visit (INDEPENDENT_AMBULATORY_CARE_PROVIDER_SITE_OTHER): Payer: Commercial Managed Care - HMO

## 2015-03-27 DIAGNOSIS — Z0000 Encounter for general adult medical examination without abnormal findings: Secondary | ICD-10-CM

## 2015-03-27 LAB — POCT URINALYSIS DIPSTICK
BILIRUBIN UA: NEGATIVE
Glucose, UA: NEGATIVE
Ketones, UA: NEGATIVE
LEUKOCYTES UA: NEGATIVE
NITRITE UA: NEGATIVE
PROTEIN UA: NEGATIVE
RBC UA: NEGATIVE
Spec Grav, UA: <=1.005
UROBILINOGEN UA: 0.2
pH, UA: 6.5

## 2015-03-27 LAB — LIPID PANEL
Cholesterol: 227 mg/dL — ABNORMAL HIGH (ref 0–200)
HDL: 41.3 mg/dL (ref >39.00)
LDL Cholesterol: 161 mg/dL — ABNORMAL HIGH (ref 0–99)
NonHDL: 185.7
TRIGLYCERIDES: 124 mg/dL (ref 0.0–149.0)
Total CHOL/HDL Ratio: 5
VLDL: 24.8 mg/dL (ref 0.0–40.0)

## 2015-03-27 LAB — CBC WITH DIFFERENTIAL/PLATELET
BASOS ABS: 0 10*3/uL (ref 0.0–0.1)
BASOS PCT: 0.4 % (ref 0.0–3.0)
EOS ABS: 0.1 10*3/uL (ref 0.0–0.7)
Eosinophils Relative: 1.1 % (ref 0.0–5.0)
HCT: 43.3 % (ref 39.0–52.0)
HEMOGLOBIN: 14.6 g/dL (ref 13.0–17.0)
LYMPHS ABS: 3.8 10*3/uL (ref 0.7–4.0)
Lymphocytes Relative: 44.2 % (ref 12.0–46.0)
MCHC: 33.7 g/dL (ref 30.0–36.0)
MCV: 89.8 fl (ref 78.0–100.0)
MONO ABS: 0.6 10*3/uL (ref 0.1–1.0)
Monocytes Relative: 7 % (ref 3.0–12.0)
NEUTROS ABS: 4.1 10*3/uL (ref 1.4–7.7)
Neutrophils Relative %: 47.3 % (ref 43.0–77.0)
Platelets: 275 10*3/uL (ref 150.0–400.0)
RBC: 4.82 Mil/uL (ref 4.22–5.81)
RDW: 13.5 % (ref 11.5–15.5)
WBC: 8.7 10*3/uL (ref 4.0–10.5)

## 2015-03-27 LAB — HEPATIC FUNCTION PANEL
ALBUMIN: 4.1 g/dL (ref 3.5–5.2)
ALT: 23 U/L (ref 0–53)
AST: 19 U/L (ref 0–37)
Alkaline Phosphatase: 39 U/L (ref 39–117)
Bilirubin, Direct: 0.1 mg/dL (ref 0.0–0.3)
Total Bilirubin: 0.7 mg/dL (ref 0.2–1.2)
Total Protein: 6.8 g/dL (ref 6.0–8.3)

## 2015-03-27 LAB — BASIC METABOLIC PANEL
BUN: 19 mg/dL (ref 6–23)
CALCIUM: 9.2 mg/dL (ref 8.4–10.5)
CHLORIDE: 102 meq/L (ref 96–112)
CO2: 28 mEq/L (ref 19–32)
CREATININE: 0.86 mg/dL (ref 0.40–1.50)
GFR: 94.48 mL/min (ref >60.00)
Glucose, Bld: 94 mg/dL (ref 70–99)
Potassium: 4.4 mEq/L (ref 3.5–5.1)
Sodium: 138 mEq/L (ref 135–145)

## 2015-03-27 LAB — PSA: PSA: 0.48 ng/mL (ref 0.10–4.00)

## 2015-03-27 LAB — TSH: TSH: 1.48 u[IU]/mL (ref 0.35–4.50)

## 2015-04-03 ENCOUNTER — Encounter: Payer: Self-pay | Admitting: Family Medicine

## 2015-04-03 ENCOUNTER — Ambulatory Visit (INDEPENDENT_AMBULATORY_CARE_PROVIDER_SITE_OTHER): Payer: Commercial Managed Care - HMO | Admitting: Family Medicine

## 2015-04-03 VITALS — BP 118/66 | HR 58 | Temp 98.5°F | Ht 67.0 in | Wt 225.0 lb

## 2015-04-03 DIAGNOSIS — Z0000 Encounter for general adult medical examination without abnormal findings: Secondary | ICD-10-CM | POA: Diagnosis not present

## 2015-04-03 LAB — PSA: PSA: 0.35 ng/mL (ref 0.10–4.00)

## 2015-04-03 NOTE — Progress Notes (Signed)
Pre visit review using our clinic review tool, if applicable. No additional management support is needed unless otherwise documented below in the visit note. 

## 2015-04-03 NOTE — Progress Notes (Signed)
   Subjective:    Patient ID: Dennis Wade, male    DOB: 01-Sep-1951, 64 y.o.   MRN: [id number]  HPI 64 yr old male for a cpx. He feels well and has no concerns. He still sees Dr. Karsten Ro yearly to follow up bladder cancer and for a prostate exam. He started seeing the [facility] also , and he had extensive lab work done there recently. This was notable mostly for mildly high lipids. A PSA was not drawn however.    Review of Systems  Constitutional: Negative.   HENT: Negative.   Eyes: Negative.   Respiratory: Negative.   Cardiovascular: Negative.   Gastrointestinal: Negative.   Genitourinary: Negative.   Musculoskeletal: Negative.   Skin: Negative.   Neurological: Negative.   Psychiatric/Behavioral: Negative.        Objective:   Physical Exam  Constitutional: He is oriented to person, place, and time. He appears well-developed and well-nourished. No distress.  HENT:  Head: Normocephalic and atraumatic.  Right Ear: External ear normal.  Left Ear: External ear normal.  Nose: Nose normal.  Mouth/Throat: Oropharynx is clear and moist. No oropharyngeal exudate.  Eyes: Conjunctivae and EOM are normal. Pupils are equal, round, and reactive to light. Right eye exhibits no discharge. Left eye exhibits no discharge. No scleral icterus.  Neck: Neck supple. No JVD present. No tracheal deviation present. No thyromegaly present.  Cardiovascular: Normal rate, regular rhythm, normal heart sounds and intact distal pulses.  Exam reveals no gallop and no friction rub.   No murmur heard. EKG stable with RBBB  Pulmonary/Chest: Effort normal and breath sounds normal. No respiratory distress. He has no wheezes. He has no rales. He exhibits no tenderness.  Abdominal: Soft. Bowel sounds are normal. He exhibits no distension and no mass. There is no tenderness. There is no rebound and no guarding.  Genitourinary: Rectum normal, prostate normal and penis normal. Guaiac negative stool. No penile  tenderness.  Musculoskeletal: Normal range of motion. He exhibits no edema or tenderness.  Lymphadenopathy:    He has no cervical adenopathy.  Neurological: He is alert and oriented to person, place, and time. He has normal reflexes. No cranial nerve deficit. He exhibits normal muscle tone. Coordination normal.  Skin: Skin is warm and dry. No rash noted. He is not diaphoretic. No erythema. No pallor.  Psychiatric: He has a normal mood and affect. His behavior is normal. Judgment and thought content normal.          Assessment & Plan:  Well exam. Get a PSA today. See Dr. Karsten Ro as above. We reviewed diet and exercise advice.

## 2016-01-13 ENCOUNTER — Telehealth: Payer: Self-pay | Admitting: Family Medicine

## 2016-01-14 NOTE — Telephone Encounter (Signed)
Error/ltd ° °

## 2016-04-15 ENCOUNTER — Other Ambulatory Visit (INDEPENDENT_AMBULATORY_CARE_PROVIDER_SITE_OTHER): Payer: Commercial Managed Care - HMO

## 2016-04-15 DIAGNOSIS — Z0000 Encounter for general adult medical examination without abnormal findings: Secondary | ICD-10-CM | POA: Diagnosis not present

## 2016-04-15 LAB — TSH: TSH: 2.61 u[IU]/mL (ref 0.35–4.50)

## 2016-04-15 LAB — CBC WITH DIFFERENTIAL/PLATELET
BASOS ABS: 0 10*3/uL (ref 0.0–0.1)
Basophils Relative: 0.3 % (ref 0.0–3.0)
EOS ABS: 0.2 10*3/uL (ref 0.0–0.7)
Eosinophils Relative: 2 % (ref 0.0–5.0)
HCT: 41 % (ref 39.0–52.0)
HEMOGLOBIN: 13.9 g/dL (ref 13.0–17.0)
LYMPHS ABS: 4 10*3/uL (ref 0.7–4.0)
Lymphocytes Relative: 43.4 % (ref 12.0–46.0)
MCHC: 34 g/dL (ref 30.0–36.0)
MCV: 88.4 fl (ref 78.0–100.0)
MONO ABS: 0.6 10*3/uL (ref 0.1–1.0)
Monocytes Relative: 6.4 % (ref 3.0–12.0)
NEUTROS PCT: 47.9 % (ref 43.0–77.0)
Neutro Abs: 4.4 10*3/uL (ref 1.4–7.7)
Platelets: 270 10*3/uL (ref 150.0–400.0)
RBC: 4.64 Mil/uL (ref 4.22–5.81)
RDW: 13.4 % (ref 11.5–15.5)
WBC: 9.2 10*3/uL (ref 4.0–10.5)

## 2016-04-15 LAB — POC URINALSYSI DIPSTICK (AUTOMATED)
BILIRUBIN UA: NEGATIVE
GLUCOSE UA: NEGATIVE
KETONES UA: NEGATIVE
LEUKOCYTES UA: NEGATIVE
NITRITE UA: NEGATIVE
Protein, UA: NEGATIVE
RBC UA: NEGATIVE
Spec Grav, UA: 1.015
Urobilinogen, UA: 0.2
pH, UA: 7

## 2016-04-15 LAB — HEPATIC FUNCTION PANEL
ALT: 21 U/L (ref 0–53)
AST: 25 U/L (ref 0–37)
Albumin: 4.4 g/dL (ref 3.5–5.2)
Alkaline Phosphatase: 45 U/L (ref 39–117)
BILIRUBIN DIRECT: 0.1 mg/dL (ref 0.0–0.3)
BILIRUBIN TOTAL: 0.8 mg/dL (ref 0.2–1.2)
Total Protein: 7 g/dL (ref 6.0–8.3)

## 2016-04-15 LAB — LIPID PANEL
CHOL/HDL RATIO: 5
CHOLESTEROL: 207 mg/dL — AB (ref 0–200)
HDL: 41.3 mg/dL (ref >39.00)
LDL CALC: 149 mg/dL — AB (ref 0–99)
NonHDL: 165.99
Triglycerides: 86 mg/dL (ref 0.0–149.0)
VLDL: 17.2 mg/dL (ref 0.0–40.0)

## 2016-04-15 LAB — PSA: PSA: 2.21 ng/mL (ref 0.10–4.00)

## 2016-04-15 LAB — BASIC METABOLIC PANEL
BUN: 20 mg/dL (ref 6–23)
CALCIUM: 9.2 mg/dL (ref 8.4–10.5)
CO2: 30 mEq/L (ref 19–32)
CREATININE: 1.01 mg/dL (ref 0.40–1.50)
Chloride: 101 mEq/L (ref 96–112)
GFR: 78.23 mL/min (ref >60.00)
GLUCOSE: 101 mg/dL — AB (ref 70–99)
POTASSIUM: 4.5 meq/L (ref 3.5–5.1)
Sodium: 138 mEq/L (ref 135–145)

## 2016-04-22 ENCOUNTER — Ambulatory Visit (INDEPENDENT_AMBULATORY_CARE_PROVIDER_SITE_OTHER): Payer: Commercial Managed Care - HMO | Admitting: Family Medicine

## 2016-04-22 ENCOUNTER — Encounter: Payer: Self-pay | Admitting: Family Medicine

## 2016-04-22 VITALS — BP 119/63 | HR 56 | Temp 98.2°F | Ht 67.0 in | Wt 228.0 lb

## 2016-04-22 DIAGNOSIS — N529 Male erectile dysfunction, unspecified: Secondary | ICD-10-CM | POA: Diagnosis not present

## 2016-04-22 DIAGNOSIS — Z0000 Encounter for general adult medical examination without abnormal findings: Secondary | ICD-10-CM | POA: Diagnosis not present

## 2016-04-22 DIAGNOSIS — M25571 Pain in right ankle and joints of right foot: Secondary | ICD-10-CM | POA: Diagnosis not present

## 2016-04-22 DIAGNOSIS — G4733 Obstructive sleep apnea (adult) (pediatric): Secondary | ICD-10-CM | POA: Diagnosis not present

## 2016-04-22 MED ORDER — SILDENAFIL CITRATE 20 MG PO TABS: 20.0000 mg | ORAL_TABLET | ORAL | 0 refills | Status: DC | PRN

## 2016-04-22 NOTE — Progress Notes (Signed)
   Subjective:    Patient ID: Dennis Wade, male    DOB: June 26, 1951, 65 y.o.   MRN: [id number]  HPI 65 yr old male for a well exam. He feels good in general. He still works out or runs 5 days a week. About 3 weeks ago he sprained his right ankle and he will see Dr. Gardenia Phlegm about this tomorrow. He has been seeing the [place] [facility] for erectile problems and they will be supplying him with generic Viagra. He is using a CPAP at night and he is sleeping better. He has a bone density exam coming up soon.    Review of Systems  Constitutional: Negative.   HENT: Negative.   Eyes: Negative.   Respiratory: Negative.   Cardiovascular: Negative.   Gastrointestinal: Negative.   Genitourinary: Negative.   Musculoskeletal: Negative.   Skin: Negative.   Neurological: Negative.   Psychiatric/Behavioral: Negative.        Objective:   Physical Exam  Constitutional: He is oriented to person, place, and time. He appears well-developed and well-nourished. No distress.  HENT:  Head: Normocephalic and atraumatic.  Right Ear: External ear normal.  Left Ear: External ear normal.  Nose: Nose normal.  Mouth/Throat: Oropharynx is clear and moist. No oropharyngeal exudate.  Eyes: Conjunctivae and EOM are normal. Pupils are equal, round, and reactive to light. Right eye exhibits no discharge. Left eye exhibits no discharge. No scleral icterus.  Neck: Neck supple. No JVD present. No tracheal deviation present. No thyromegaly present.  Cardiovascular: Normal rate, regular rhythm, normal heart sounds and intact distal pulses.  Exam reveals no gallop and no friction rub.   No murmur heard. EKG shows NSR with RBBB   Pulmonary/Chest: Effort normal and breath sounds normal. No respiratory distress. He has no wheezes. He has no rales. He exhibits no tenderness.  Abdominal: Soft. Bowel sounds are normal. He exhibits no distension and no mass. There is no tenderness. There is no rebound and no guarding.  Genitourinary:  Rectum normal, prostate normal and penis normal. Rectal exam shows guaiac negative stool. No penile tenderness.  Musculoskeletal: Normal range of motion. He exhibits no edema or tenderness.  Lymphadenopathy:    He has no cervical adenopathy.  Neurological: He is alert and oriented to person, place, and time. He has normal reflexes. No cranial nerve deficit. He exhibits normal muscle tone. Coordination normal.  Skin: Skin is warm and dry. No rash noted. He is not diaphoretic. No erythema. No pallor.  Psychiatric: He has a normal mood and affect. His behavior is normal. Judgment and thought content normal.          Assessment & Plan:  Well exam. We discussed diet and exercise. I suggested he get back on krill oil supplements to get the LDL down.  Laurey Morale, MD

## 2016-04-22 NOTE — Progress Notes (Signed)
CC: Right ankle pain  HPI: Patient is a very pleasant 65 year old gentleman who was seen previously for quadriceps strain. . Patient's x-rays do show some degenerative disc disease at L5-S1 and mild with rest her lumbar spine.  Patient states that it was secondary to his statin and discontinued the use.   More of an ankle problem right-sided. Patient is still an avid runner. Patient rolled his ankle a proximal wing 3 weeks ago. States that started having swelling on the lateral aspect now on the medial aspect. States that it has not Him from running but is uncomfortable. Significant tightness afterwards. Density just has a soreness that does not seem to go away. Denies any numbness. No trouble with daily activities but would like this pain to go away. Still attempting to run 20 miles a week  Past Medical History:  Diagnosis Date  . Asthma   . Bladder cancer [facility])    sees Dr. Karsten Ro   . Hyperlipidemia   . Hypertension   . Sleep apnea, obstructive    sees the [place] [facility], uses a CPAP at night   Past Surgical History:  Procedure Laterality Date  . BLADDER SURGERY     3 times from 2005 to 2012  . COLONOSCOPY  2011   in [place], [place], benign polyps, repeat in 10 yrs    Social History  Substance Use Topics  . Smoking status: Never Smoker  . Smokeless tobacco: Never Used  . Alcohol use 0.0 oz/week     Comment: occ   Allergies  Allergen Reactions  . Crestor [Rosuvastatin Calcium] Other (See Comments)    myalgias  . Hydromet [Hydrocodone-Homatropine]     Vomiting   . Lisinopril     Cough   . Morphine And Related     Dizzy and sick on stomach   Family History  Problem Relation Age of Onset  . Prostate cancer Father   No family history of rheumatological diseases.   Past medical, surgical, family and social history reviewed. Medications reviewed all in the electronic medical record.   Review of Systems: No headache, visual changes, nausea, vomiting, diarrhea, constipation,  dizziness, abdominal pain, skin rash, fevers, chills, night sweats, weight loss, swollen lymph nodes, body aches, joint swelling, muscle aches, chest pain, shortness of breath, mood changes.   Objective:    Blood pressure 118/62, pulse 60, height 5\' 6"  (1.676 m), weight 227 lb (103 kg), SpO2 99 %.   General: No apparent distress alert and oriented x3 mood and affect normal, dressed appropriately.  HEENT: Pupils equal, extraocular movements intact Respiratory: Patient's speak in full sentences and does not appear short of breath Cardiovascular: No lower extremity edema, non tender, no erythema Skin: Warm dry intact with no signs of infection or rash on extremities or on axial skeleton. Abdomen: Soft nontender Neuro: Cranial nerves II through XII are intact, neurovascularly intact in all extremities with 2+ DTRs and 2+ pulses. Lymph: No lymphadenopathy of posterior or anterior cervical chain or axillae bilaterally.  Gait mild antalgic gait MSK: Non tender with full range of motion and good stability and symmetric strength and tone of shoulders, elbows, wrist, hip, knees bilaterally.  Ankle:right Trace swelling compared to the contralateral side Next last 2 in all planes Strength is 5/5 in all directions. Mild positive anterior drawer Talar dome nontender; No pain at base of 5th MT; No tenderness over cuboid; No tenderness over N spot or navicular prominence Tender to palpation over the posterior tibialis tendon just inferior to the  medial malleolus as well as some pain over the medial malleolus itself. No sign of peroneal tendon subluxations or tenderness to palpation Negative tarsal tunnel tinel's Able to walk 4 steps. Contralateral ankle unremarkable  MSK US performed of: *Right ankle  This study was ordered, performed, and interpreted by Charlann Boxer D.O.  Foot/Ankle:   right ankle  All structures visualized.   Talar dome unremarkable  Ankle mortise without effusion. mild arthritic  changes   Posterior tibialis does have hypoechoic changes. Patient's medial malleolus in the area does seem to have a nonhealing full impression fracture noted. No significant displacement. Is the area of tenderness. Patient complains of. , flexor hallucis longus, and flexor digitorum longus tendons unremarkable on long and transverse views without sheath effusions. Achilles tendon visualized along length of tendon and unremarkable on long and transverse views without sheath effusion. Anterior Talofibular Ligament chronic degenerative changes noted.    IMPRESSION:  Posterior tibialis tendinitis, non-healing medial malleolus fracture, chronic ankle sprain     Impression and Recommendations:     This case required medical decision making of moderate complexity.

## 2016-04-22 NOTE — Progress Notes (Signed)
Pre visit review using our clinic review tool, if applicable. No additional management support is needed unless otherwise documented below in the visit note. 

## 2016-04-23 ENCOUNTER — Ambulatory Visit (INDEPENDENT_AMBULATORY_CARE_PROVIDER_SITE_OTHER): Payer: Commercial Managed Care - HMO | Admitting: Family Medicine

## 2016-04-23 ENCOUNTER — Encounter: Payer: Self-pay | Admitting: Family Medicine

## 2016-04-23 ENCOUNTER — Other Ambulatory Visit: Payer: Self-pay

## 2016-04-23 VITALS — BP 118/62 | HR 60 | Ht 66.0 in | Wt 227.0 lb

## 2016-04-23 DIAGNOSIS — M25571 Pain in right ankle and joints of right foot: Secondary | ICD-10-CM

## 2016-04-23 DIAGNOSIS — M76821 Posterior tibial tendinitis, right leg: Secondary | ICD-10-CM

## 2016-04-23 DIAGNOSIS — S8251XA Displaced fracture of medial malleolus of right tibia, initial encounter for closed fracture: Secondary | ICD-10-CM | POA: Diagnosis not present

## 2016-04-23 DIAGNOSIS — M76829 Posterior tibial tendinitis, unspecified leg: Secondary | ICD-10-CM | POA: Insufficient documentation

## 2016-04-23 MED ORDER — VITAMIN D (ERGOCALCIFEROL) 1.25 MG (50000 UNIT) PO CAPS: 50000.0000 [IU] | ORAL_CAPSULE | ORAL | 0 refills | Status: DC

## 2016-04-23 NOTE — Patient Instructions (Signed)
Good to see you.  Ice 20 minutes 2 times daily. Usually after activity and before bed. Exercises 3 times a week.  Wear brace with running for next 3 weeks.  Once weekly vitamin D to help with the healing.  pennsaid pinkie amount topically 2 times daily as needed.  Avoid being barefoot. [employer] "total support" online would be great  See me again in 3 weeks

## 2016-04-23 NOTE — Assessment & Plan Note (Signed)
Patient does have a refractory and reactive posterior tibialis tendon injury. I believe that this is secondary to the nonhealing medial malleolus fracture. Patient has been compensating. We discussed bracing which patient was given 1 today. Icing, vitamin D supplementation to help with the healing. We discussed avoiding certain activities. Follow-up again in 3 weeks. Patient is improving we'll give him different home exercises and advance appropriately.

## 2016-04-23 NOTE — Assessment & Plan Note (Signed)
Patient does have an avulsion fracture that seems to be nonhealing. This occurred 3 weeks ago. Patient given vitamin D supplementation and bracing. We discussed proper shoes and over-the-counter orthotics. We discussed icing regimen. Patient come back and see me again in 3 weeks. We will ultrasound at that time likely make sure patient is healing appropriate.

## 2016-04-24 NOTE — Addendum Note (Signed)
Addended by: Alysia Penna A on: 04/24/2016 08:37 AM   Modules accepted: Orders

## 2016-05-07 ENCOUNTER — Encounter: Payer: Self-pay | Admitting: Family Medicine

## 2016-05-07 ENCOUNTER — Ambulatory Visit (INDEPENDENT_AMBULATORY_CARE_PROVIDER_SITE_OTHER): Payer: Commercial Managed Care - HMO | Admitting: Family Medicine

## 2016-05-07 ENCOUNTER — Other Ambulatory Visit: Payer: Self-pay

## 2016-05-07 VITALS — BP 110/72 | HR 59 | Wt 228.0 lb

## 2016-05-07 DIAGNOSIS — S8251XD Displaced fracture of medial malleolus of right tibia, subsequent encounter for closed fracture with routine healing: Secondary | ICD-10-CM

## 2016-05-07 DIAGNOSIS — M25571 Pain in right ankle and joints of right foot: Secondary | ICD-10-CM | POA: Diagnosis not present

## 2016-05-07 DIAGNOSIS — M76821 Posterior tibial tendinitis, right leg: Secondary | ICD-10-CM

## 2016-05-07 NOTE — Progress Notes (Signed)
CC: Right ankle pain  HPI: Patient is a very pleasant 65 year old gentleman who was seen previously for quadriceps strain. . Patient's x-rays do show some degenerative disc disease at L5-S1 and mild with rest her lumbar spine.  Patient states that it was secondary to his statin and discontinued the use.   Patient states that he is improving. States that there is still a dull throbbing aching sensation when he runs greater than 3-5 miles. Patient states though that with regular daily activities she is no longer having trouble. No side effects to the vitamin D. Doing the bracing occasionally. Patient states that he feels that the walking she is may be worse than the running shoes. Still cannot be performed. Past Medical History:  Diagnosis Date  . Asthma   . Bladder cancer [facility])    sees Dr. Karsten Ro   . Hyperlipidemia   . Hypertension   . Sleep apnea, obstructive    sees the [place] clinic, uses a CPAP at night   Past Surgical History:  Procedure Laterality Date  . BLADDER SURGERY     3 times from 2005 to 2012  . COLONOSCOPY  2011   in [place], [place], benign polyps, repeat in 10 yrs    Social History  Substance Use Topics  . Smoking status: Never Smoker  . Smokeless tobacco: Never Used  . Alcohol use 0.0 oz/week     Comment: occ   Allergies  Allergen Reactions  . Crestor [Rosuvastatin Calcium] Other (See Comments)    myalgias  . Hydromet [Hydrocodone-Homatropine]     Vomiting   . Lisinopril     Cough   . Morphine And Related     Dizzy and sick on stomach   Family History  Problem Relation Age of Onset  . Prostate cancer Father   No family history of rheumatological diseases.   Past medical, surgical, family and social history reviewed. Medications reviewed all in the electronic medical record.   Review of Systems: No headache, visual changes, nausea, vomiting, diarrhea, constipation, dizziness, abdominal pain, skin rash, fevers, chills, night sweats, weight loss, swollen  lymph nodes, body aches, joint swelling, muscle aches, chest pain, shortness of breath, mood changes.   Objective:    Weight 228 lb (103.4 kg).   General: No apparent distress alert and oriented x3 mood and affect normal, dressed appropriately.  HEENT: Pupils equal, extraocular movements intact Respiratory: Patient's speak in full sentences and does not appear short of breath Cardiovascular: No lower extremity edema, non tender, no erythema Skin: Warm dry intact with no signs of infection or rash on extremities or on axial skeleton. Abdomen: Soft nontender Neuro: Cranial nerves II through XII are intact, neurovascularly intact in all extremities with 2+ DTRs and 2+ pulses. Lymph: No lymphadenopathy of posterior or anterior cervical chain or axillae bilaterally.  Gait mild antalgic gait MSK: Non tender with full range of motion and good stability and symmetric strength and tone of shoulders, elbows, wrist, hip, knees bilaterally.  Ankle:right No swelling noted Continues to have some difficulty with range of motion. Strength is 5/5 in all directions. Negative anterior drawer Talar dome nontender; No pain at base of 5th MT; No tenderness over cuboid; No tenderness over N spot or navicular prominence Tender to palpation over the posterior tibialis tendon just inferior to the medial malleolus but no pain over the medial malleolus this time. No sign of peroneal tendon subluxations or tenderness to palpation Negative tarsal tunnel tinel's Able to walk 4 steps. Contralateral ankle unremarkable  MSK US performed of: *Right ankle  This study was ordered, performed, and interpreted by Charlann Boxer D.O.  Foot/Ankle:   right ankle  All structures visualized.   Talar dome unremarkable  Ankle mortise without effusion. mild arthritic changes  Posterior tibialis tendinitis and is better but still has some inferior to the medial malleolus. Fracture of the medial malleolus does have good callus  formation. , flexor hallucis longus, and flexor digitorum longus tendons unremarkable on long and transverse views without sheath effusions. Achilles tendon visualized along length of tendon and unremarkable on long and transverse views without sheath effusion. Anterior Talofibular Ligament chronic degenerative changes noted.    IMPRESSION:  Continued posterior tibialis tendinitis but patient does have healing of the ankle medial malleolus fracture     Impression and Recommendations:     This case required medical decision making of moderate complexity.

## 2016-05-07 NOTE — Assessment & Plan Note (Signed)
Look good. Healing well.  No change continue the vitamin D

## 2016-05-07 NOTE — Assessment & Plan Note (Signed)
Improving but still noted. Patient did not get the over-the-counter orthotics for the heel lift. Encourage him to do this. Patient data different compression sleeve and the brace at this time. We discussed icing regimen. Patient come back and see me again in 2-3 weeks and if worsening symptoms we'll consider injection.

## 2016-05-07 NOTE — Patient Instructions (Addendum)
God to see you  Ice 20 minutes 2 times daily. Usually after activity and before bed. Keep up the exercises [email or web address] x linked ankle size large [employer] orthotics "total support" online would be great [employer].  Pennsaid if you need it See me again in 2 weeks and we will make sure swelling is gone or we will inject.

## 2016-07-15 ENCOUNTER — Encounter: Payer: Self-pay | Admitting: Family Medicine

## 2016-07-15 ENCOUNTER — Ambulatory Visit (INDEPENDENT_AMBULATORY_CARE_PROVIDER_SITE_OTHER): Payer: Commercial Managed Care - HMO | Admitting: Family Medicine

## 2016-07-15 DIAGNOSIS — M76821 Posterior tibial tendinitis, right leg: Secondary | ICD-10-CM | POA: Diagnosis not present

## 2016-07-15 NOTE — Assessment & Plan Note (Signed)
Worsening symptoms. Discussed with patient at great length. Resume patient's vitamin D supplementation. Given topical anti-inflammatories again. Encourage him to decrease running by approximately 25% before his rays. Differential includes potential stress fracture of the navicular bone but at the moment he seems to be angulated without any significant pain. Discussed with patient though that me may need to decrease activity for short time afterwards. Patient is willing to do that. Follow-up again in 3 weeks to make sure patient is doing well after his race.

## 2016-07-15 NOTE — Progress Notes (Signed)
  CC: Right ankle pain f/u   HPI: Patient is a very pleasant 65 year old gentleman who was seen previously for posterior tibialis tendinitis. Patient also had a medial malleolus fracture that seemed to be healing. Has been training for his half marathon. Had been doing very well but started having some mild discomfort again in the same area. Patient states it is not as severe. Patient denies any swelling. States though that after a 7 mile run he was unable to walk for 24 hours but now seems to be better.   Past Medical History:  Diagnosis Date  . Asthma   . Bladder cancer [facility])    sees Dr. Karsten Ro   . Hyperlipidemia   . Hypertension   . Sleep apnea, obstructive    sees the [place] clinic, uses a CPAP at night   Past Surgical History:  Procedure Laterality Date  . BLADDER SURGERY     3 times from 2005 to 2012  . COLONOSCOPY  2011   in [place], [place], benign polyps, repeat in 10 yrs    Social History  Substance Use Topics  . Smoking status: Never Smoker  . Smokeless tobacco: Never Used  . Alcohol use 0.0 oz/week     Comment: occ   Allergies  Allergen Reactions  . Crestor [Rosuvastatin Calcium] Other (See Comments)    myalgias  . Hydromet [Hydrocodone-Homatropine]     Vomiting   . Lisinopril     Cough   . Morphine And Related     Dizzy and sick on stomach   Family History  Problem Relation Age of Onset  . Prostate cancer Father   No family history of rheumatological diseases.   Past medical, surgical, family and social history reviewed. Medications reviewed all in the electronic medical record.   Review of Systems: No headache, visual changes, nausea, vomiting, diarrhea, constipation, dizziness, abdominal pain, skin rash, fevers, chills, night sweats, weight loss, swollen lymph nodes,, chest pain, shortness of breath, mood changes.   Objective:    There were no vitals taken for this visit.   General: No apparent distress alert and oriented x3 mood and affect normal, dressed  appropriately.  HEENT: Pupils equal, extraocular movements intact Respiratory: Patient's speak in full sentences and does not appear short of breath Cardiovascular: No lower extremity edema, non tender, no erythema Skin: Warm dry intact with no signs of infection or rash on extremities or on axial skeleton. Abdomen: Soft nontender Neuro: Cranial nerves II through XII are intact, neurovascularly intact in all extremities with 2+ DTRs and 2+ pulses. Lymph: No lymphadenopathy of posterior or anterior cervical chain or axillae bilaterally.  Gait mild antalgic gait MSK: Non tender with full range of motion and good stability and symmetric strength and tone of shoulders, elbows, wrist, hip, knees bilaterally.  Ankle:right No swelling noted Full range of motion which is better than previous exam. Strength is 5/5 in all directions. Negative anterior drawer Talar dome nontender; No pain at base of 5th MT; No tenderness over cuboid; Minimal discomfort over the navicular prominence Findings Tender to palpation over the posterior tibialis tendon just inferior to the medial malleolus but no pain over the medial malleolus  No sign of peroneal tendon subluxations or tenderness to palpation Negative tarsal tunnel tinel's Able to walk 4 steps. Contralateral ankle unremarkable      Impression and Recommendations:     This case required medical decision making of moderate complexity.

## 2016-07-15 NOTE — Patient Instructions (Signed)
God to see you  Decrease 25% of running until race Ice after running Continue the brace Walking shoe wear heel lift for now.  pennsaid pinkie amount topically 2 times daily as needed.  See me before [place].

## 2016-07-22 ENCOUNTER — Telehealth: Payer: Self-pay | Admitting: Emergency Medicine

## 2016-07-22 NOTE — Telephone Encounter (Signed)
Pt called and asked that you give him a call back about his ankle injury. Please advise thanks.

## 2016-07-23 ENCOUNTER — Encounter: Payer: Self-pay | Admitting: Family Medicine

## 2016-07-23 ENCOUNTER — Ambulatory Visit: Payer: Self-pay

## 2016-07-23 ENCOUNTER — Ambulatory Visit (INDEPENDENT_AMBULATORY_CARE_PROVIDER_SITE_OTHER): Payer: Commercial Managed Care - HMO | Admitting: Family Medicine

## 2016-07-23 VITALS — BP 100/70 | HR 67 | Ht 66.0 in | Wt 218.0 lb

## 2016-07-23 DIAGNOSIS — G5751 Tarsal tunnel syndrome, right lower limb: Secondary | ICD-10-CM

## 2016-07-23 DIAGNOSIS — M25571 Pain in right ankle and joints of right foot: Secondary | ICD-10-CM | POA: Diagnosis not present

## 2016-07-23 MED ORDER — PREDNISONE 50 MG PO TABS: 50.0000 mg | ORAL_TABLET | Freq: Every day | ORAL | 0 refills | Status: DC

## 2016-07-23 MED ORDER — VITAMIN D (ERGOCALCIFEROL) 1.25 MG (50000 UNIT) PO CAPS: 50000.0000 [IU] | ORAL_CAPSULE | ORAL | 0 refills | Status: DC

## 2016-07-23 NOTE — Progress Notes (Signed)
CC: Right ankle pain f/u   HPI: Patient is a very pleasant 65 year old gentleman who was seen previously for posterior tibialis tendinitis. Patient also had a medial malleolus fracture that seemed to be healing. Has been training for his half marathon. Had been doing very well but started having some mild discomfort again in the same area. Patient states it is not as severe. Patient denies any swelling. States though that after a 7 mile run he was unable to walk for 24 hours but now seems to be better. Patient states it only seems to her when running. Does have pain. This Saturday.   Past Medical History:  Diagnosis Date  . Asthma   . Bladder cancer [facility])    sees Dr. Karsten Ro   . Hyperlipidemia   . Hypertension   . Sleep apnea, obstructive    sees the [place] clinic, uses a CPAP at night   Past Surgical History:  Procedure Laterality Date  . BLADDER SURGERY     3 times from 2005 to 2012  . COLONOSCOPY  2011   in [place], [place], benign polyps, repeat in 10 yrs    Social History  Substance Use Topics  . Smoking status: Never Smoker  . Smokeless tobacco: Never Used  . Alcohol use 0.0 oz/week     Comment: occ   Allergies  Allergen Reactions  . Crestor [Rosuvastatin Calcium] Other (See Comments)    myalgias  . Hydromet [Hydrocodone-Homatropine]     Vomiting   . Lisinopril     Cough   . Morphine And Related     Dizzy and sick on stomach   Family History  Problem Relation Age of Onset  . Prostate cancer Father   No family history of rheumatological diseases.   Past medical, surgical, family and social history reviewed. Medications reviewed all in the electronic medical record.   Review of Systems: No headache, visual changes, nausea, vomiting, diarrhea, constipation, dizziness, abdominal pain, skin rash, fevers, chills, night sweats, weight loss, swollen lymph nodes,, chest pain, shortness of breath, mood changes.   Objective:    Blood pressure 100/70, pulse 67, height 5\' 6"   (1.676 m), weight 218 lb (98.9 kg), SpO2 94 %.   General: No apparent distress alert and oriented x3 mood and affect normal, dressed appropriately.  HEENT: Pupils equal, extraocular movements intact Respiratory: Patient's speak in full sentences and does not appear short of breath Cardiovascular: No lower extremity edema, non tender, no erythema Skin: Warm dry intact with no signs of infection or rash on extremities or on axial skeleton. Abdomen: Soft nontender Neuro: Cranial nerves II through XII are intact, neurovascularly intact in all extremities with 2+ DTRs and 2+ pulses. Lymph: No lymphadenopathy of posterior or anterior cervical chain or axillae bilaterally.  Gait normal MSK: Non tender with full range of motion and good stability and symmetric strength and tone of shoulders, elbows, wrist, hip, knees bilaterally.  Ankle:right No swelling noted Full range of motion  Strength is 5/5 in all directions. Negative anterior drawer Talar dome nontender; No pain at base of 5th MT; No tenderness over cuboid; Continued mild discomfort over the navicular prominence Nontender over the posterior tibialis tendon  No sign of peroneal tendon subluxations or tenderness to palpation Negative tarsal tunnel tinel's Able to walk 4 steps. Contralateral ankle unremarkable  MSK US performed of: Right This study was ordered, performed, and interpreted by Charlann Boxer D.O.  Foot/Ankle:   In area where patient did have the medial malleolus fracture patient  does have a callus formation still noted. Seems to be causing possible impingement over the ankle mortise. Posterior tibialis tendon shows no hypoechoic changes or any increasing Doppler flow. Trace effusion of the ankle joint  IMPRESSION:  Stable tarsal tunnel syndrome      Impression and Recommendations:     This case required medical decision making of moderate complexity.

## 2016-07-23 NOTE — Telephone Encounter (Signed)
Patient has called back in regard?  °

## 2016-07-23 NOTE — Telephone Encounter (Signed)
Pt is coming in at 2pm today.

## 2016-07-23 NOTE — Patient Instructions (Signed)
Good to see you  Ice 20 minutes 2 times daily. Usually after activity and before bed. prednisone daily for 5 days.  Once weekly vitamin D [place] or [place] would be good or look at [employer].  Have a great race see me again  2-3 weeks

## 2016-07-23 NOTE — Assessment & Plan Note (Signed)
Patient is having more of a tarsal tunnel syndrome. Discussed with patient at great length. We discussed icing regimen and home exercises, we discussed which activities doing which ones to avoid. Patient will continue to stay active otherwise. Follow-up with me again in 4 weeks. Worsening symptoms we'll consider injection. Also possible MRI would be necessary.

## 2016-07-23 NOTE — Telephone Encounter (Signed)
We could see him this afternoon Or I can send him a 5 day burst of prednisone to help with the pain while he is out of town and then see him when he returns.

## 2016-07-23 NOTE — Telephone Encounter (Signed)
Spoke with pt, he states that he did 2.5 miles this past Saturday & his foot is in a lot of pain. Pt states he can barely walk. He is leaving tomorrow to go out of town, what can he do?

## 2016-12-17 ENCOUNTER — Encounter: Payer: Self-pay | Admitting: Gastroenterology

## 2017-05-05 DIAGNOSIS — E559 Vitamin D deficiency, unspecified: Secondary | ICD-10-CM | POA: Diagnosis not present

## 2017-05-05 DIAGNOSIS — Z7982 Long term (current) use of aspirin: Secondary | ICD-10-CM | POA: Diagnosis not present

## 2017-05-05 DIAGNOSIS — E785 Hyperlipidemia, unspecified: Secondary | ICD-10-CM | POA: Diagnosis not present

## 2017-05-05 DIAGNOSIS — J45990 Exercise induced bronchospasm: Secondary | ICD-10-CM | POA: Diagnosis not present

## 2017-05-05 DIAGNOSIS — H547 Unspecified visual loss: Secondary | ICD-10-CM | POA: Diagnosis not present

## 2017-05-05 DIAGNOSIS — Z6835 Body mass index (BMI) 35.0-35.9, adult: Secondary | ICD-10-CM | POA: Diagnosis not present

## 2017-05-05 DIAGNOSIS — E669 Obesity, unspecified: Secondary | ICD-10-CM | POA: Diagnosis not present

## 2017-05-05 DIAGNOSIS — Z961 Presence of intraocular lens: Secondary | ICD-10-CM | POA: Diagnosis not present

## 2017-05-17 ENCOUNTER — Encounter: Payer: Commercial Managed Care - HMO | Admitting: Family Medicine

## 2020-06-20 ENCOUNTER — Emergency Department (HOSPITAL_COMMUNITY)
Admission: EM | Admit: 2020-06-20 | Discharge: 2020-06-20 | Disposition: A | Discharge status: Home / Self Care | Payer: No Typology Code available for payment source | Attending: Emergency Medicine | Admitting: Emergency Medicine

## 2020-06-20 ENCOUNTER — Emergency Department (HOSPITAL_COMMUNITY): Payer: No Typology Code available for payment source

## 2020-06-20 ENCOUNTER — Other Ambulatory Visit: Payer: Self-pay

## 2020-06-20 ENCOUNTER — Encounter (HOSPITAL_COMMUNITY): Payer: Self-pay

## 2020-06-20 DIAGNOSIS — K859 Acute pancreatitis without necrosis or infection, unspecified: Secondary | ICD-10-CM | POA: Insufficient documentation

## 2020-06-20 DIAGNOSIS — Z7951 Long term (current) use of inhaled steroids: Secondary | ICD-10-CM | POA: Insufficient documentation

## 2020-06-20 DIAGNOSIS — I10 Essential (primary) hypertension: Secondary | ICD-10-CM | POA: Insufficient documentation

## 2020-06-20 DIAGNOSIS — R1032 Left lower quadrant pain: Secondary | ICD-10-CM | POA: Diagnosis present

## 2020-06-20 DIAGNOSIS — Z79899 Other long term (current) drug therapy: Secondary | ICD-10-CM | POA: Insufficient documentation

## 2020-06-20 DIAGNOSIS — Z7982 Long term (current) use of aspirin: Secondary | ICD-10-CM | POA: Diagnosis not present

## 2020-06-20 DIAGNOSIS — J45909 Unspecified asthma, uncomplicated: Secondary | ICD-10-CM | POA: Insufficient documentation

## 2020-06-20 LAB — COMPREHENSIVE METABOLIC PANEL
ALT: 20 U/L (ref 0–44)
AST: 20 U/L (ref 15–41)
Albumin: 4.3 g/dL (ref 3.5–5.0)
Alkaline Phosphatase: 47 U/L (ref 38–126)
Anion gap: 11 (ref 5–15)
BUN: 16 mg/dL (ref 8–23)
CO2: 26 mmol/L (ref 22–32)
Calcium: 9.1 mg/dL (ref 8.9–10.3)
Chloride: 100 mmol/L (ref 98–111)
Creatinine, Ser: 0.84 mg/dL (ref 0.61–1.24)
GFR calc Af Amer: >60 mL/min (ref >60)
GFR calc non Af Amer: >60 mL/min (ref >60)
Glucose, Bld: 122 mg/dL — ABNORMAL HIGH (ref 70–99)
Potassium: 4.6 mmol/L (ref 3.5–5.1)
Sodium: 137 mmol/L (ref 135–145)
Total Bilirubin: 0.8 mg/dL (ref 0.3–1.2)
Total Protein: 7.6 g/dL (ref 6.5–8.1)

## 2020-06-20 LAB — CBC
HCT: 44.1 % (ref 39.0–52.0)
Hemoglobin: 14.8 g/dL (ref 13.0–17.0)
MCH: 29.9 pg (ref 26.0–34.0)
MCHC: 33.6 g/dL (ref 30.0–36.0)
MCV: 89.1 fL (ref 80.0–100.0)
Platelets: 263 10*3/uL (ref 150–400)
RBC: 4.95 MIL/uL (ref 4.22–5.81)
RDW: 12.2 % (ref 11.5–15.5)
WBC: 12.6 10*3/uL — ABNORMAL HIGH (ref 4.0–10.5)
nRBC: 0 % (ref 0.0–0.2)

## 2020-06-20 LAB — URINALYSIS, ROUTINE W REFLEX MICROSCOPIC
Bacteria, UA: NONE SEEN
Bilirubin Urine: NEGATIVE
Glucose, UA: NEGATIVE mg/dL
Ketones, ur: NEGATIVE mg/dL
Leukocytes,Ua: NEGATIVE
Nitrite: NEGATIVE
Protein, ur: NEGATIVE mg/dL
Specific Gravity, Urine: 1.009 (ref 1.005–1.030)
pH: 6 (ref 5.0–8.0)

## 2020-06-20 LAB — LIPASE, BLOOD: Lipase: 63 U/L — ABNORMAL HIGH (ref 11–51)

## 2020-06-20 MED ORDER — IOHEXOL 300 MG/ML  SOLN
100.0000 mL | Freq: Once | INTRAMUSCULAR | Status: AC | PRN
  Administered 2020-06-20: 100 mL via INTRAVENOUS

## 2020-06-20 MED ORDER — ONDANSETRON HCL 4 MG/2ML IJ SOLN
4.0000 mg | Freq: Once | INTRAMUSCULAR | Status: AC
  Administered 2020-06-20: 4 mg via INTRAVENOUS
  Filled 2020-06-20: qty 2

## 2020-06-20 MED ORDER — ONDANSETRON HCL 4 MG PO TABS: 4.0000 mg | ORAL_TABLET | Freq: Three times a day (TID) | ORAL | 0 refills | Status: DC | PRN

## 2020-06-20 MED ORDER — MORPHINE SULFATE (PF) 4 MG/ML IV SOLN
4.0000 mg | Freq: Once | INTRAVENOUS | Status: AC
  Administered 2020-06-20: 4 mg via INTRAVENOUS
  Filled 2020-06-20: qty 1

## 2020-06-20 MED ORDER — SODIUM CHLORIDE 0.9 % IV BOLUS
1000.0000 mL | Freq: Once | INTRAVENOUS | Status: AC
  Administered 2020-06-20: 1000 mL via INTRAVENOUS

## 2020-06-20 NOTE — ED Triage Notes (Signed)
Patient c/o mid abdominal pain since last night. Patient also c/o nausea, but no V/D.

## 2020-06-20 NOTE — ED Provider Notes (Signed)
[facility] DEPT Provider Note   CSN: [id number] Arrival date & time: 06/20/20  1041     History Chief Complaint  Patient presents with  . Abdominal Pain  . Nausea    Dennis Wade is a 69 y.o. male.  The history is provided by the patient and medical records. No language interpreter was used.  Abdominal Pain    69 year old male significant history of bladder cancer, hyperlipidemia, hypertension presents ED for evaluation abdominal pain.  Patient developed gradual onset of low abdominal pain since last night.  Pain described as a pressure sharp sensation across his abdomen of moderate in severity.  Pain is been persistent and occasionally intensified.  He endorsed abdominal tightness, tries to belch without relief, and having decrease in appetite as if  eating or drinking may worsen the pain.  He feels a bit cool but denies any associated fever.  No runny nose sneezing or coughing no chest pain or shortness of breath no back pain dysuria hematuria.  Last bowel movement was yesterday and no constipation or diarrhea.  Denies any blood in his stool.  Has had colonoscopy several years ago and it was normal.  He is fully vaccinated for COVID-19.  He denies any specific treatment tried and currently rates his pain a 6 out of 10.  Patient also denies dysuria, hematuria, penis or testicle pain or any hernia.  Past Medical History:  Diagnosis Date  . Asthma   . Bladder cancer [facility])    sees Dr. Karsten Ro   . Hyperlipidemia   . Hypertension   . Sleep apnea, obstructive    sees the [place] clinic, uses a CPAP at night    Patient Active Problem List   Diagnosis Date Noted  . Tarsal tunnel syndrome of right side 07/23/2016  . Tibialis posterior tendinitis 04/23/2016  . Avulsion fracture of medial malleolus of right tibia 04/23/2016  . Obstructive sleep apnea 04/22/2016  . Erectile dysfunction 04/22/2016  . Bladder cancer ([place]) 07/23/2014  . Extrinsic asthma  07/23/2014  . Nonallopathic lesion of lumbosacral region 05/24/2014  . Nonallopathic lesion of sacral region 05/24/2014  . Nonallopathic lesion of thoracic region 05/24/2014  . Back pain 05/03/2014  . Bilateral leg pain 04/25/2014  . Quadriceps strain 09/08/2013    Past Surgical History:  Procedure Laterality Date  . BLADDER SURGERY     3 times from 2005 to 2012  . COLONOSCOPY  2011   in [place], [place], benign polyps, repeat in 10 yrs        Family History  Problem Relation Age of Onset  . Prostate cancer Father     Social History   Tobacco Use  . Smoking status: Never Smoker  . Smokeless tobacco: Never Used  Vaping Use  . Vaping Use: Never used  Substance Use Topics  . Alcohol use: Yes    Alcohol/week: 0.0 standard drinks    Comment: occ  . Drug use: No    Home Medications Prior to Admission medications   Medication Sig Start Date End Date Taking? Authorizing Provider  albuterol (PROVENTIL HFA;VENTOLIN HFA) 108 (90 BASE) MCG/ACT inhaler Inhale 2 puffs into the lungs every 4 (four) hours as needed for wheezing or shortness of breath. 07/23/14   Laurey Morale, MD  aspirin 81 MG tablet Take 81 mg by mouth daily.    [provider]  Multiple Vitamin (MULTIVITAMIN WITH MINERALS) TABS Take 1 tablet by mouth daily.    [provider]  predniSONE (DELTASONE)  50 MG tablet Take 1 tablet (50 mg total) by mouth daily. 07/23/16   Lyndal Pulley, DO  sildenafil (REVATIO) 20 MG tablet Take 1 tablet (20 mg total) by mouth as needed. 04/22/16   Laurey Morale, MD  Vitamin D, Ergocalciferol, (DRISDOL) 50000 units CAPS capsule Take 1 capsule (50,000 Units total) by mouth every 7 (seven) days. 07/23/16   Lyndal Pulley, DO    Allergies    Crestor [rosuvastatin calcium], Hydromet [hydrocodone-homatropine], Lisinopril, and Morphine and related  Review of Systems   Review of Systems  Gastrointestinal: Positive for abdominal pain.  All other systems reviewed and are  negative.   Physical Exam Updated Vital Signs BP (!) 147/90 (BP Location: Left Arm)   Pulse (!) 59   Temp 98.3 F (36.8 C) (Oral)   Resp 16   Ht 5\' 6"  (1.676 m)   Wt 97.5 kg   SpO2 95%   BMI 34.70 kg/m   Physical Exam Vitals and nursing note reviewed.  Constitutional:      General: He is not in acute distress.    Appearance: He is well-developed.  HENT:     Head: Atraumatic.  Eyes:     Conjunctiva/sclera: Conjunctivae normal.  Cardiovascular:     Rate and Rhythm: Normal rate and regular rhythm.  Pulmonary:     Effort: Pulmonary effort is normal.     Breath sounds: Normal breath sounds.  Abdominal:     General: Abdomen is flat.     Palpations: Abdomen is soft.     Tenderness: There is abdominal tenderness in the periumbilical area and left lower quadrant. There is no guarding or rebound. Negative signs include Murphy's sign, Rovsing's sign and McBurney's sign.  Musculoskeletal:     Cervical back: Neck supple.  Skin:    Findings: No rash.  Neurological:     Mental Status: He is alert and oriented to person, place, and time.     ED Results / Procedures / Treatments   Labs (all labs ordered are listed, but only abnormal results are displayed) Labs Reviewed  LIPASE, BLOOD - Abnormal; Notable for the following components:      Result Value   Lipase 63 (*)    All other components within normal limits  COMPREHENSIVE METABOLIC PANEL - Abnormal; Notable for the following components:   Glucose, Bld 122 (*)    All other components within normal limits  CBC - Abnormal; Notable for the following components:   WBC 12.6 (*)    All other components within normal limits  URINALYSIS, ROUTINE W REFLEX MICROSCOPIC    EKG None  Radiology CT ABDOMEN PELVIS W CONTRAST  Result Date: 06/20/2020 CLINICAL DATA:  diverticulitis suspected Patient c/o mid abdominal pain since last night. Patient also c/o nausea, but no vomiting or diarrhea. Lipase 63. EXAM: CT ABDOMEN AND PELVIS  WITH CONTRAST TECHNIQUE: Multidetector CT imaging of the abdomen and pelvis was performed using the standard protocol following bolus administration of intravenous contrast. CONTRAST:  184mL OMNIPAQUE IOHEXOL 300 MG/ML  SOLN COMPARISON:  None. FINDINGS: Lower chest: Left lower lobe subpleural micronodule (2:7). A 6 mm left base nodular-like density (2:18). Hepatobiliary: No focal liver abnormality is seen. Several subcentimeter calcified densities are noted within the gallbladder lumen. No gallbladder wall thickening or pericholecystic fluid. No biliary ductal dilatation. Pancreas: Trace proximal peripancreatic fat stranding. Spleen: Normal in size without focal abnormality. Adrenals/Urinary Tract: No adrenal nodule bilaterally. Bilateral kidneys enhance symmetrically. There is a 6.4 cm right interpolar cystic  renal lesion within associated 2.3 cm 2 component/axial. No hydronephrosis. No hydroureter. The urinary bladder is unremarkable. Stomach/Bowel: Stomach is within normal limits. Appendix appears normal. No evidence of bowel wall thickening, distention, or inflammatory changes. Vascular/Lymphatic: No abdominal aorta or iliac aneurysm. At least mild atherosclerotic plaque of the aorta and its branches. Borderline enlarged portacaval lymph node measuring up to 1.1 cm. Several prominent retroperitoneal lymph node with as an example a 0.9 cm aortocaval lymph node (2:32). No pelvic or inguinal lymphadenopathy. Reproductive: Prostate is unremarkable. Other: No intraperitoneal free fluid. No intraperitoneal free gas. No organized fluid collection. Musculoskeletal: Tiny fat containing umbilical hernia. No suspicious lytic or blastic osseous lesions. No acute displaced fracture. Multilevel degenerative changes of the spine. IMPRESSION: 1. Mild acute pancreatitis. 2. Cholelithiasis with no CT evidence of acute cholecystitis. 3. 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 Recommend further  workup with renal protocol MRI or CT. 4. Prominent/borderline enlarged retroperitoneal lymph nodes. Findings may be reactive in the setting of acute pancreatitis. 5. A 6 mm left base nodular like density may represent rounded atelectasis versus a nodule. Non-contrast chest CT at 6-12 months is recommended. If the nodule is stable at time of repeat CT, then future CT at 18-24 months (from today's scan) is considered optional for low-risk patients, but is recommended for high-risk patients. This recommendation follows the consensus statement: Guidelines for Management of Incidental Pulmonary Nodules Detected on CT Images: From the Fleischner Society 2017; Radiology 2017; 284:228-243. 6.  Aortic Atherosclerosis (ICD10-I70.0). *These results were called by telephone at the time of interpretation on 06/20/2020 at 5:13 pm to provider Dr Mali Sheldon, who verbally acknowledged these results. Electronically Signed   By: Iven Finn M.D.   On: 06/20/2020 17:25    Procedures Procedures (including critical care time)  Medications Ordered in ED Medications  morphine 4 MG/ML injection 4 mg (4 mg Intravenous Given 06/20/20 1556)  ondansetron (ZOFRAN) injection 4 mg (4 mg Intravenous Given 06/20/20 1556)  iohexol (OMNIPAQUE) 300 MG/ML solution 100 mL (100 mLs Intravenous Contrast Given 06/20/20 1649)  sodium chloride 0.9 % bolus 1,000 mL (1,000 mLs Intravenous New Bag/Given 06/20/20 1826)    ED Course  I have reviewed the triage vital signs and the nursing notes.  Pertinent labs & imaging results that were available during my care of the patient were reviewed by me and considered in my medical decision making (see chart for details).    MDM Rules/Calculators/A&P                          BP (!) 142/82 (BP Location: Right Arm)   Pulse (!) 58   Temp 98.3 F (36.8 C) (Oral)   Resp 16   Ht 5\' 6"  (1.676 m)   Wt 97.5 kg   SpO2 100%   BMI 34.70 kg/m   Final Clinical Impression(s) / ED Diagnoses Final  diagnoses:  Acute pancreatitis, unspecified complication status, unspecified pancreatitis type    Rx / DC Orders ED Discharge Orders         Ordered    ondansetron (ZOFRAN) 4 MG tablet  Every 8 hours PRN        06/20/20 1839         3:17 PM Patient with abdominal pain and decrease in appetite since yesterday.  Pain localized to the upper umbilical region as well as left lower quadrant.  Pain is suggestive of diverticular disease.  No right  lower quadrant tenderness to suggest appendicitis, no significant right upper quadrant tenderness to suggest biliary disease.  Will obtain abdominal and pelvis CT scan for evaluation.  Pain medication and antinausea medication given.  5:20 PM Labs remarkable for mildly elevated lipase of 63.  Elevated white count of 12.6.  An abdominal pelvis CT scan obtained showing findings suggestive of mild pancreatitis.  There is an indeterminate renal cyst that will need urology evaluation, likely in an outpatient setting.  Care discussed with Dr. Karle Starch.   5:33 PM Patient state he is aware of this renal cyst and his doctor is aware of it as well.  They can continue with monitoring.  At this time he feels better after receiving pain medication.  We will also provide IV fluid but patient prefers to go home.  Encourage n.p.o. and bowel rest to allow pancreas to heal.     Domenic Moras, PA-C 06/20/20 1844    Truddie Hidden, MD 06/20/20 1921

## 2020-06-20 NOTE — ED Notes (Signed)
Patient refused zofran when offered.

## 2020-06-20 NOTE — Discharge Instructions (Signed)
You have been diagnosed with having acute pancreatitis.  Please follow instruction below for care.  Incidentally CT scan noted that you have a cyst on your right kidney, discussed this with your doctor.  Furthermore, there is a nodule on the left side of your lungs that was seen on the CT scan as well.  Please request your doctor for a repeat chest CT scan in 6 to 12 months for surveillance.  Return promptly to the ER if your symptoms worsen or if you have any other concern.

## 2021-07-07 ENCOUNTER — Ambulatory Visit (INDEPENDENT_AMBULATORY_CARE_PROVIDER_SITE_OTHER): Payer: Medicare PPO

## 2021-07-07 ENCOUNTER — Other Ambulatory Visit: Payer: Self-pay

## 2021-07-07 ENCOUNTER — Ambulatory Visit (INDEPENDENT_AMBULATORY_CARE_PROVIDER_SITE_OTHER): Payer: Medicare PPO | Admitting: Sports Medicine

## 2021-07-07 VITALS — BP 140/82 | HR 60 | Ht 66.0 in | Wt 226.0 lb

## 2021-07-07 DIAGNOSIS — T148XXA Other injury of unspecified body region, initial encounter: Secondary | ICD-10-CM | POA: Diagnosis not present

## 2021-07-07 DIAGNOSIS — M549 Dorsalgia, unspecified: Secondary | ICD-10-CM | POA: Diagnosis not present

## 2021-07-07 MED ORDER — MELOXICAM 15 MG PO TABS: 15.0000 mg | ORAL_TABLET | Freq: Every day | ORAL | 0 refills | Status: AC

## 2021-07-07 NOTE — Patient Instructions (Addendum)
Good to see you  Meloxicam 15mg  for 2 weeks  See me again as needed

## 2021-07-07 NOTE — Progress Notes (Signed)
    Benito Mccreedy D.[place] Phone: [phone]   Assessment and Plan:     1. Mid back pain 2. Contusion of muscle -Acute, uncomplicated, initial sports medicine visit - Likely strain versus contusion of latissimus dorsi on right based on PE, HPI, unremarkable x-rays - May discontinue muscle relaxer as patient is not getting benefit - Start meloxicam 15 mg daily x2 weeks.  May use remaining as needed for pain control - Advised to rest for the next 1 week and then slowly increase activity and week to - X-rays obtained in clinic.  My interpretation: No acute fracture, vertebrae height maintained.  DDD seen with vertebral spurring and mildly generalized decreased disc space. - DG Thoracic Spine 2 View; Future - DG Lumbar Spine 2-3 Views; Future    Pertinent previous records reviewed include ct from 06/20/20, lumbar xray from 2015   Follow Up: As needed if no improvement or worsening of symptoms.  Would consider formal PT +/- advanced imaging.   Subjective:   I, Judy Pimple, am serving as a scribe for Dr. Glennon Mac  Chief Complaint: mid back pain   HPI:   07/07/21 Patient is a 70 year old male that is presenting with mid back pain going on for 1 month that started on the right side but last night the pain radiated to the R side. Patient states he only has the pain when he lays down to sleep. States that the pain will start around 3 in the morning 8/10 states one night he almost bit through a mouth guard. Pain does not radiate. Patient states that he plays pickle ball and he fell backward on his buttock onto the cement and back onto the wire guard. [facility] muscle relaxer last week that he take before bed but wears off in the middle of the night.   Relevant Historical Information: OSA  Additional pertinent review of systems negative.   Current Outpatient Medications:    BIOTIN PO, Take 1  capsule by mouth daily., Disp: , Rfl:    Chlorpheniramine Maleate (ALLERGY PO), Take 1 tablet by mouth daily., Disp: , Rfl:    meloxicam (MOBIC) 15 MG tablet, Take 1 tablet (15 mg total) by mouth daily., Disp: 30 tablet, Rfl: 0   Objective:     Vitals:   07/07/21 1254  BP: 140/82  Pulse: 60  SpO2: 96%  Weight: 226 lb (102.5 kg)  Height: 5\' 6"  (1.676 m)      Body mass index is 36.48 kg/m.    Physical Exam:    Gen: Appears well, nad, nontoxic and pleasant Psych: Alert and oriented, appropriate mood and affect Neuro: sensation intact, strength is 5/5 in upper and lower extremities, muscle tone wnl Skin: no susupicious lesions or rashes  Back - Normal skin, Spine with normal alignment and no deformity.   No tenderness to vertebral process palpation.   Paraspinous muscles are not tender and without spasm Straight leg raise negative Trendelenberg negative  Mild TTP to right flank.  No bruising Mild tenderness to right flank with resisted left forward lumbar rotation  Electronically signed by:  Benito Mccreedy D.[facility] 1:37 PM 07/07/21

## 2022-06-23 NOTE — Progress Notes (Unsigned)
    Dennis Wade D.[address] [place] [address] Phone: [phone]   Assessment and Plan:     There are no diagnoses linked to this encounter.  ***   Pertinent previous records reviewed include ***   Follow Up: ***     Subjective:   I, Dennis Wade, am serving as a Education administrator for Doctor Glennon Mac  Chief Complaint: achilles pain    HPI:   06/24/2022 Patient is a 71 year old male complaining of achilles pain. Patient states  Relevant Historical Information: ***  Additional pertinent review of systems negative.   Current Outpatient Medications:    BIOTIN PO, Take 1 capsule by mouth daily., Disp: , Rfl:    Chlorpheniramine Maleate (ALLERGY PO), Take 1 tablet by mouth daily., Disp: , Rfl:    meloxicam (MOBIC) 15 MG tablet, Take 1 tablet (15 mg total) by mouth daily., Disp: 30 tablet, Rfl: 0   Objective:     There were no vitals filed for this visit.    There is no height or weight on file to calculate BMI.    Physical Exam:    ***   Electronically signed by:  Dennis Wade D.[facility] 1:25 PM 06/23/22

## 2022-06-24 ENCOUNTER — Ambulatory Visit: Payer: PPO | Admitting: Sports Medicine

## 2022-06-24 VITALS — BP 120/84 | HR 77 | Ht 66.0 in | Wt 233.0 lb

## 2022-06-24 DIAGNOSIS — M7662 Achilles tendinitis, left leg: Secondary | ICD-10-CM

## 2022-06-24 DIAGNOSIS — M7581 Other shoulder lesions, right shoulder: Secondary | ICD-10-CM

## 2022-06-24 MED ORDER — MELOXICAM 15 MG PO TABS: 15.0000 mg | ORAL_TABLET | Freq: Every day | ORAL | 0 refills | Status: AC

## 2022-06-24 NOTE — Patient Instructions (Addendum)
Good to see you Dennis Wade and shoulder HEP  - Start meloxicam 15 mg daily x2 weeks.  If still having pain after 2 weeks, complete 3rd-week of meloxicam. May use remaining meloxicam as needed once daily for pain control.  Do not to use additional NSAIDs while taking meloxicam.  May use Tylenol [phone] mg 2 to 3 times a day for breakthrough pain. As needed follow up if no improvement 2-3 week follow up, use a boot for 2-4 weeks if no improvement

## 2022-07-21 ENCOUNTER — Other Ambulatory Visit: Payer: Self-pay | Admitting: Sports Medicine

## 2022-08-24 IMAGING — CT CT ABD-PELV W/ CM
2 of 5 series · 15 of 46 positions shown, 17 images · IV contrast (omnipaque)
Comparison: None.

CLINICAL DATA: diverticulitis suspected Patient c/o mid abdominal
pain since last night. Patient also c/o nausea, but no vomiting or
diarrhea. Lipase 63.

EXAM:
CT ABDOMEN AND PELVIS WITH CONTRAST
TECHNIQUE: Multidetector CT imaging of the abdomen and pelvis was performed
using the standard protocol following bolus administration of
intravenous contrast.
CONTRAST:  100mL OMNIPAQUE IOHEXOL 300 MG/ML  SOLN

[Series 2: axial st · axial · 0.97mm/px · z∈[+777,+1207]mm · 12 of 100 slices shown, 14 images]
[im 7/100  soft-tissue]
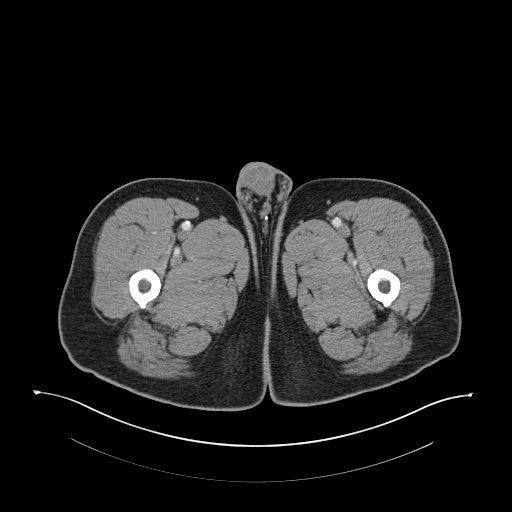
[im 7/100  bone]
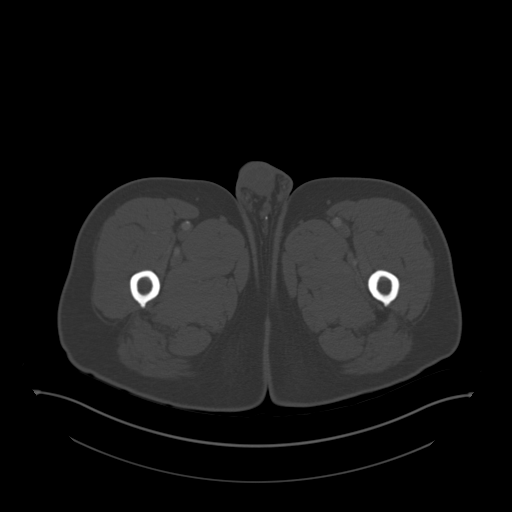
[im 14/100  soft-tissue]
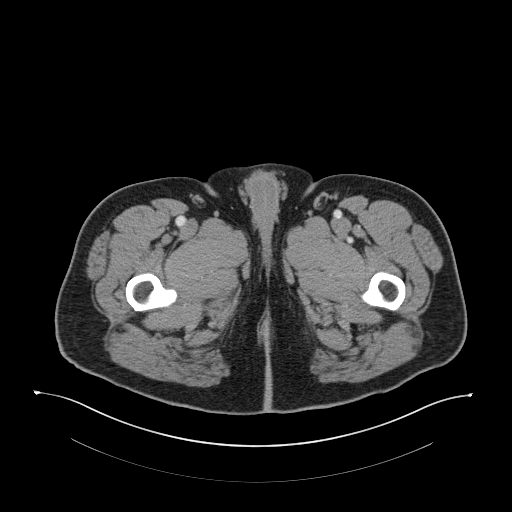
[im 20/100  soft-tissue]
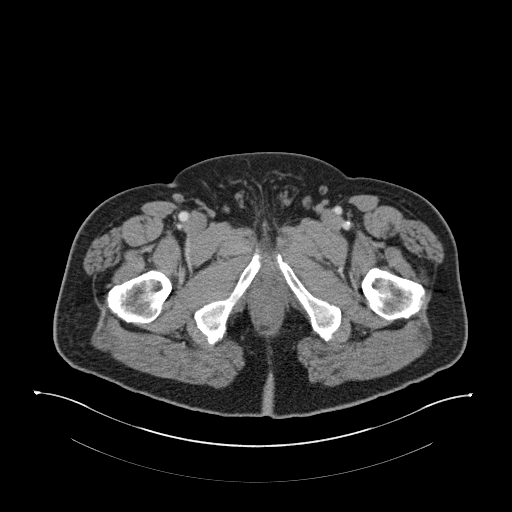
[im 34/100  soft-tissue]
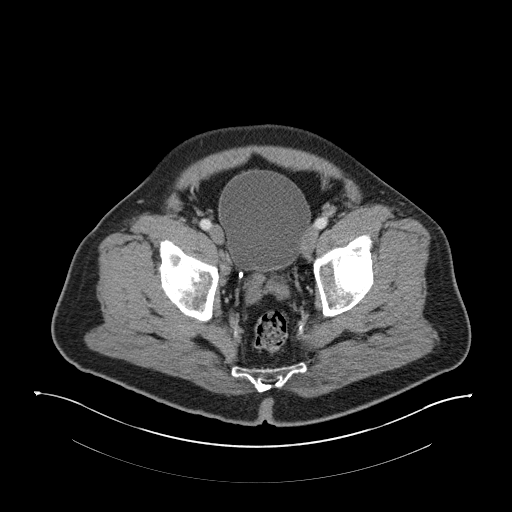
[im 40/100  soft-tissue]
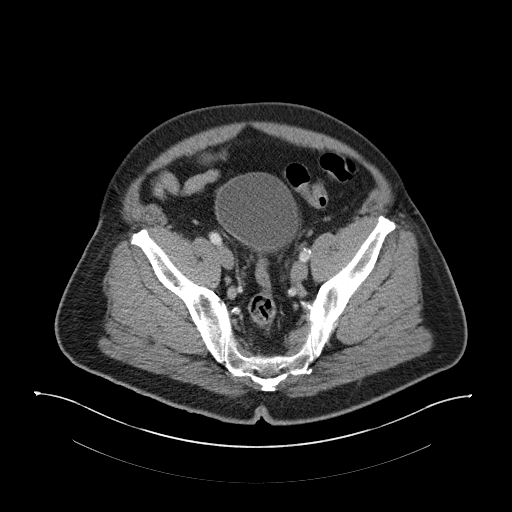
[im 47/100  soft-tissue]
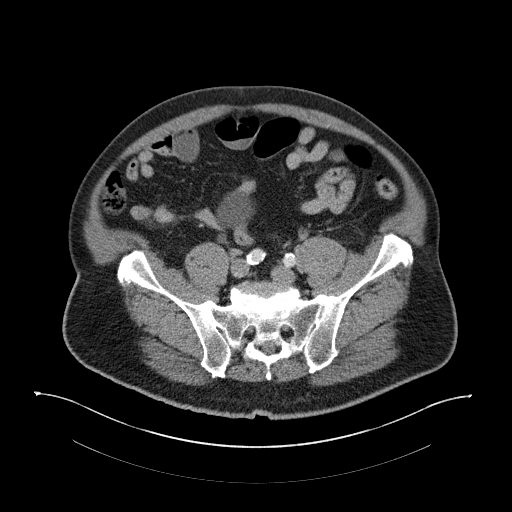
[im 53/100  soft-tissue]
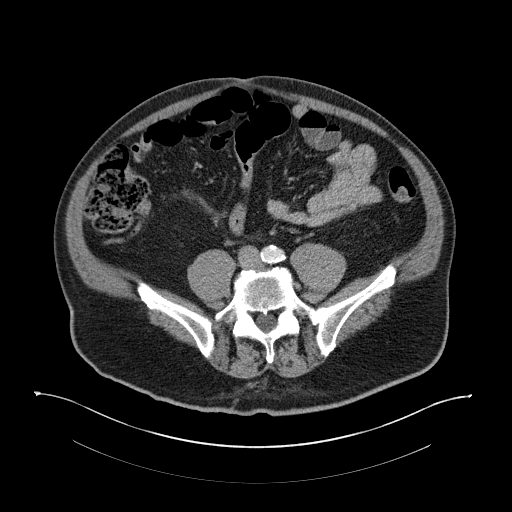
[im 60/100  soft-tissue]
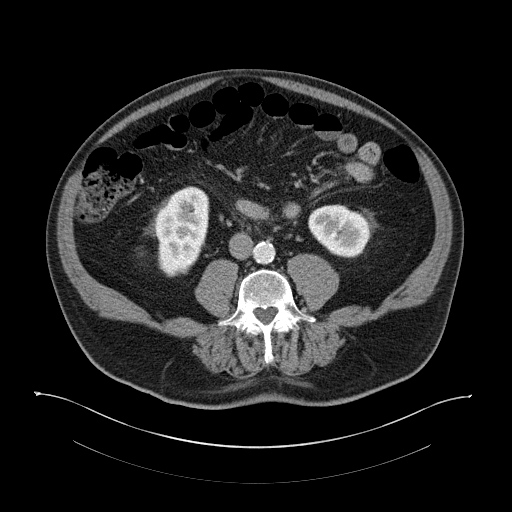
[im 67/100  soft-tissue]
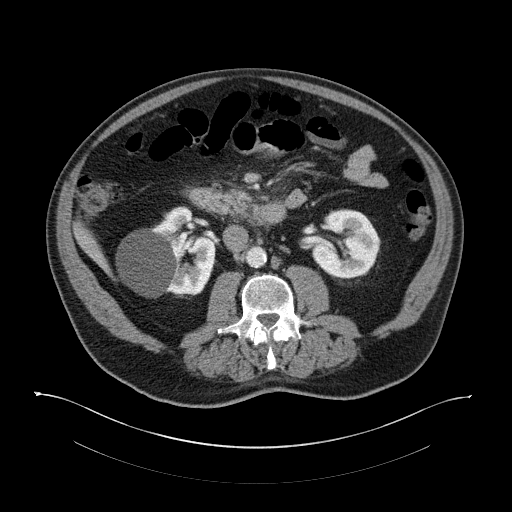
[im 67/100  bone]
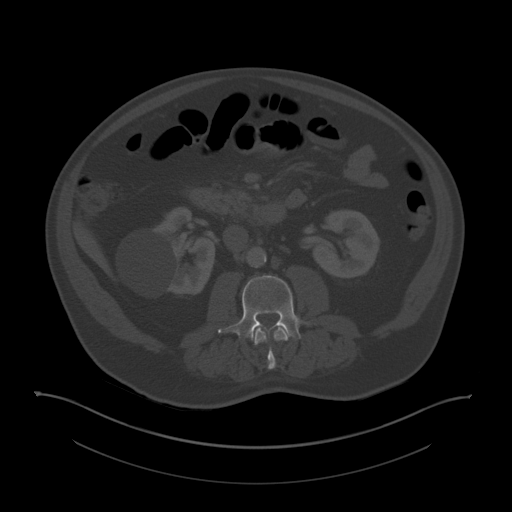
[im 80/100  soft-tissue]
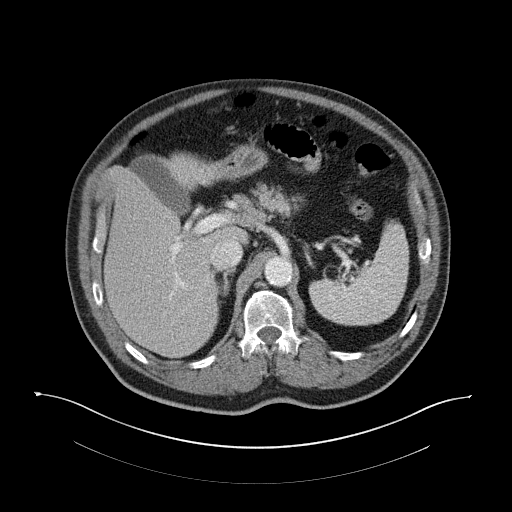
[im 86/100  soft-tissue]
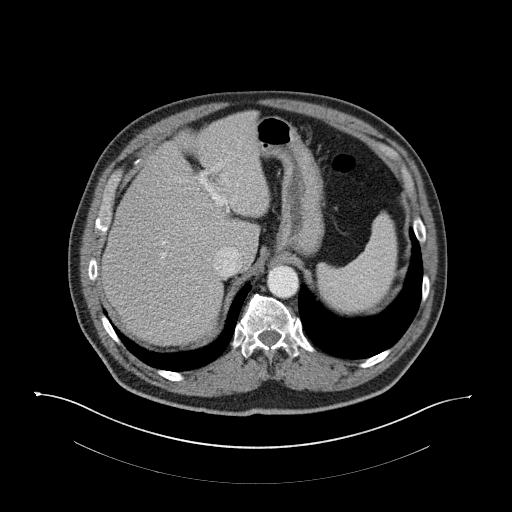
[im 93/100  soft-tissue]
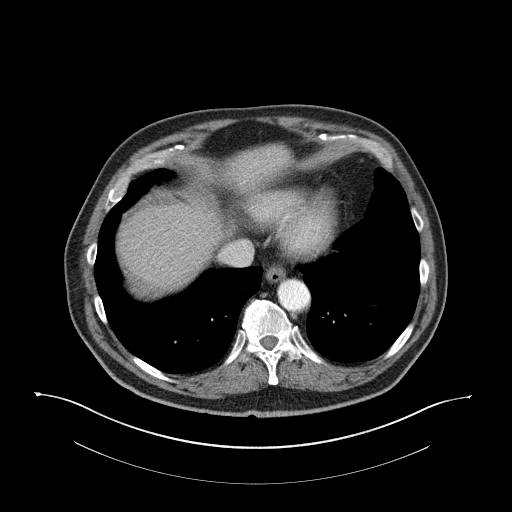

[Series 5: coronal st · coronal · 0.94mm/px · 3 of 186 slices shown]
[im 62/186  soft-tissue]
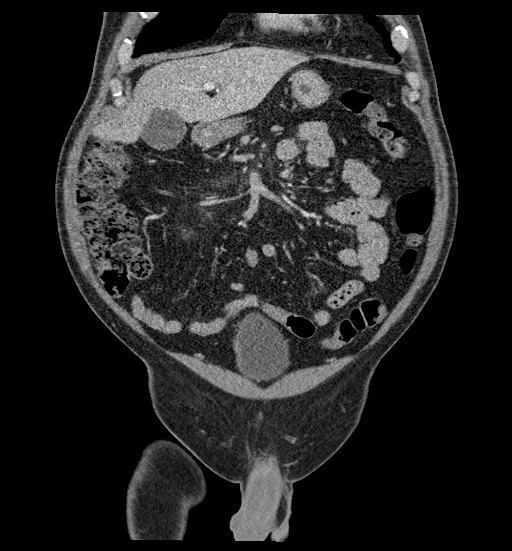
[im 83/186  soft-tissue]
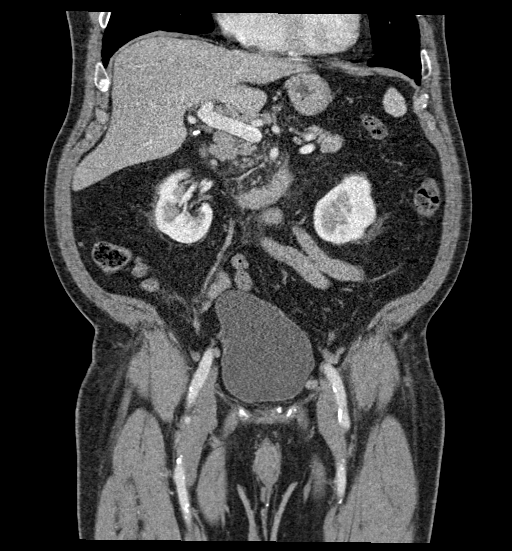
[im 103/186  soft-tissue]
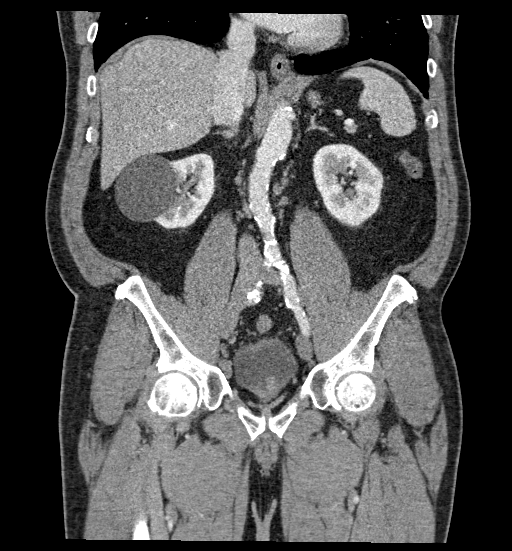

[15 of 46 positions shown; findings below may reference images not displayed]

FINDINGS: Lower chest: Left lower lobe subpleural micronodule ([DATE]). A 6 mm
left base nodular-like density ([DATE]).

Hepatobiliary: No focal liver abnormality is seen. Several
subcentimeter calcified densities are noted within the gallbladder
lumen. No gallbladder wall thickening or pericholecystic fluid. No
biliary ductal dilatation.

Pancreas: Trace proximal peripancreatic fat stranding.

Spleen: Normal in size without focal abnormality.

Adrenals/Urinary Tract: No adrenal nodule bilaterally. Bilateral
kidneys enhance symmetrically. There is a 6.4 cm right interpolar
cystic renal lesion within associated 2.3 cm 2 component/axial. No
hydronephrosis. No hydroureter. The urinary bladder is unremarkable.

Stomach/Bowel: Stomach is within normal limits. Appendix appears
normal. No evidence of bowel wall thickening, distention, or
inflammatory changes.

Vascular/Lymphatic: No abdominal aorta or iliac aneurysm. At least
mild atherosclerotic plaque of the aorta and its branches.
Borderline enlarged portacaval lymph node measuring up to 1.1 cm.
Several prominent retroperitoneal lymph node with as an example a
0.9 cm aortocaval lymph node ([DATE]). No pelvic or inguinal
lymphadenopathy.

Reproductive: Prostate is unremarkable.

Other: No intraperitoneal free fluid. No intraperitoneal free gas.
No organized fluid collection.

Musculoskeletal:

Tiny fat containing umbilical hernia.

No suspicious lytic or blastic osseous lesions. No acute displaced
fracture. Multilevel degenerative changes of the spine.
IMPRESSION: 1. Mild acute pancreatitis.
2. Cholelithiasis with no CT evidence of acute cholecystitis.
3. Bosniak III indeterminate right renal cystic mass measuring up to
6.4 cm. Recommend further workup with renal protocol MRI or CT.
4. Prominent/borderline enlarged retroperitoneal lymph nodes.
Findings may be reactive in the setting of acute pancreatitis.
5. A 6 mm left base nodular like density may represent rounded
atelectasis versus a nodule. Non-contrast chest CT at 6-12 months is
recommended. If the nodule is stable at time of repeat CT, then
future CT at 18-24 months (from today's scan) is considered optional
for low-risk patients, but is recommended for high-risk patients.
This recommendation follows the consensus statement: Guidelines for
Management of Incidental Pulmonary Nodules Detected on CT Images:
6.  Aortic Atherosclerosis ([id number]).

*These results were called by telephone at the time of
interpretation on 06/20/2020 at [DATE] to provider Dr Nube Finger,
who verbally acknowledged these results.

## 2023-09-10 IMAGING — DX DG THORACIC SPINE 2V
3 series · 3 of 3 positions shown · non-contrast
Comparison: None.

CLINICAL DATA: Mid back pain. Right-sided mid low back pain for 1
month after a fall playing pickle ball.

EXAM:
THORACIC SPINE 2 VIEWS

[t-spine ap]
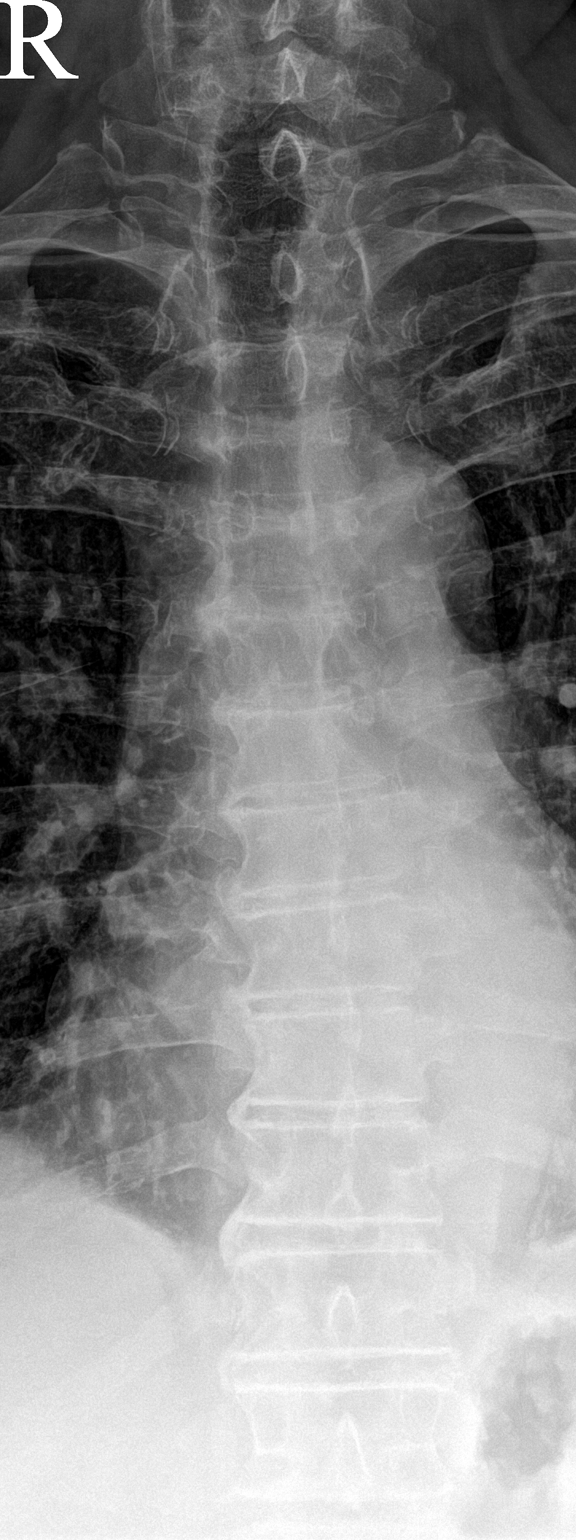

[t-spine lat]
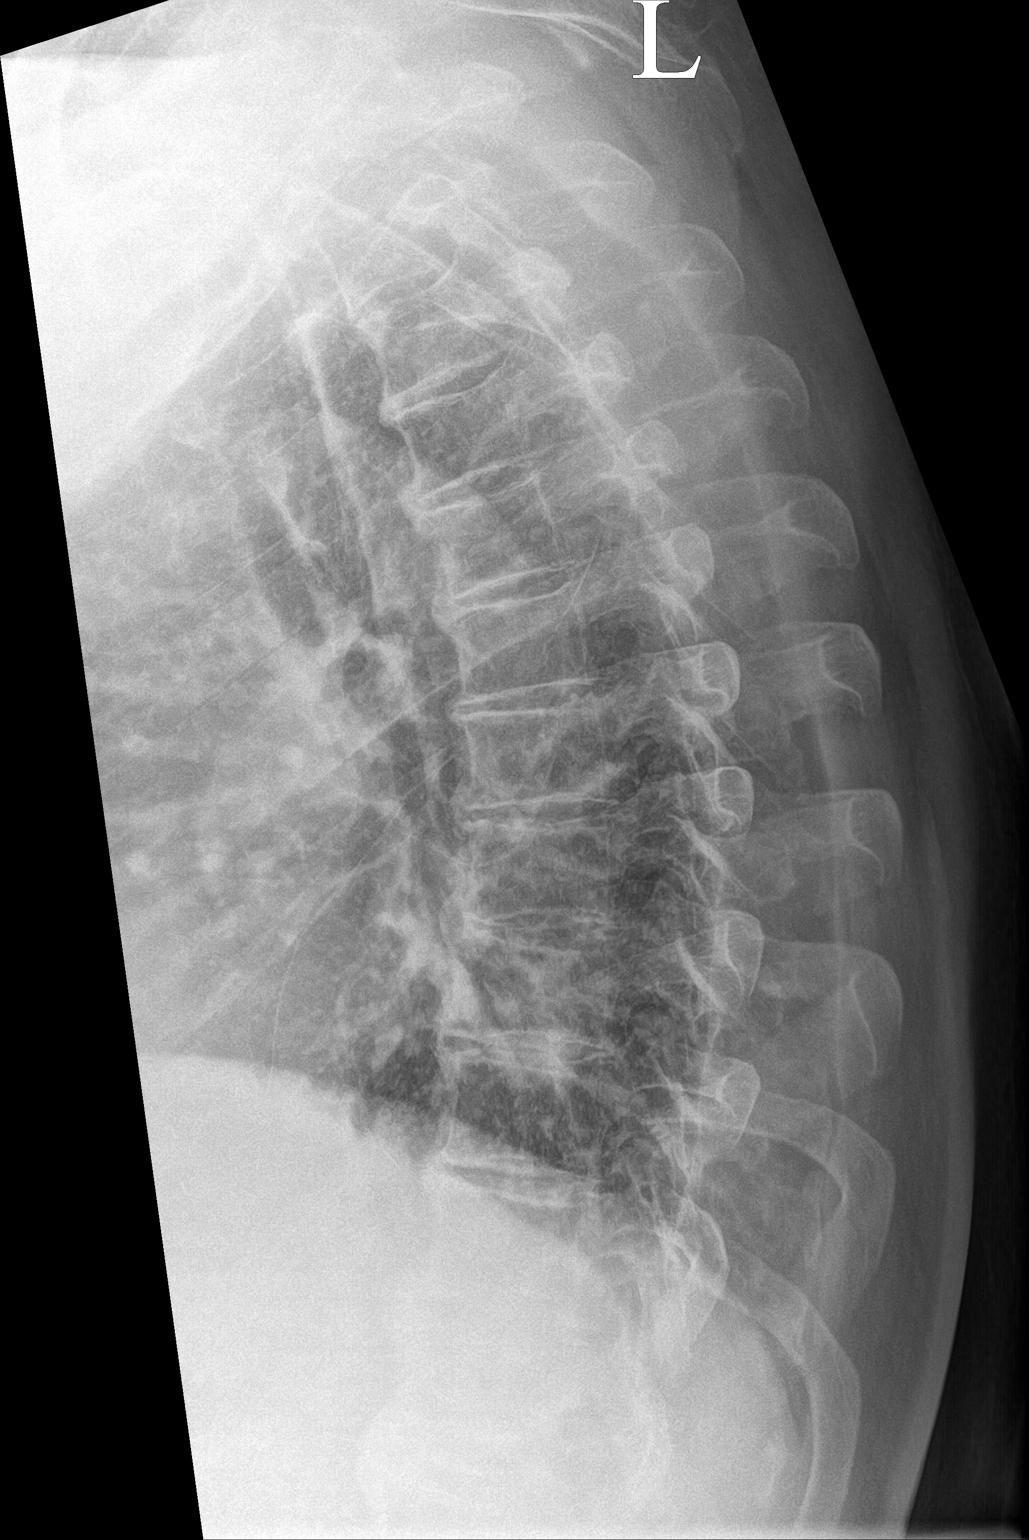

[ct-spine swimmers]
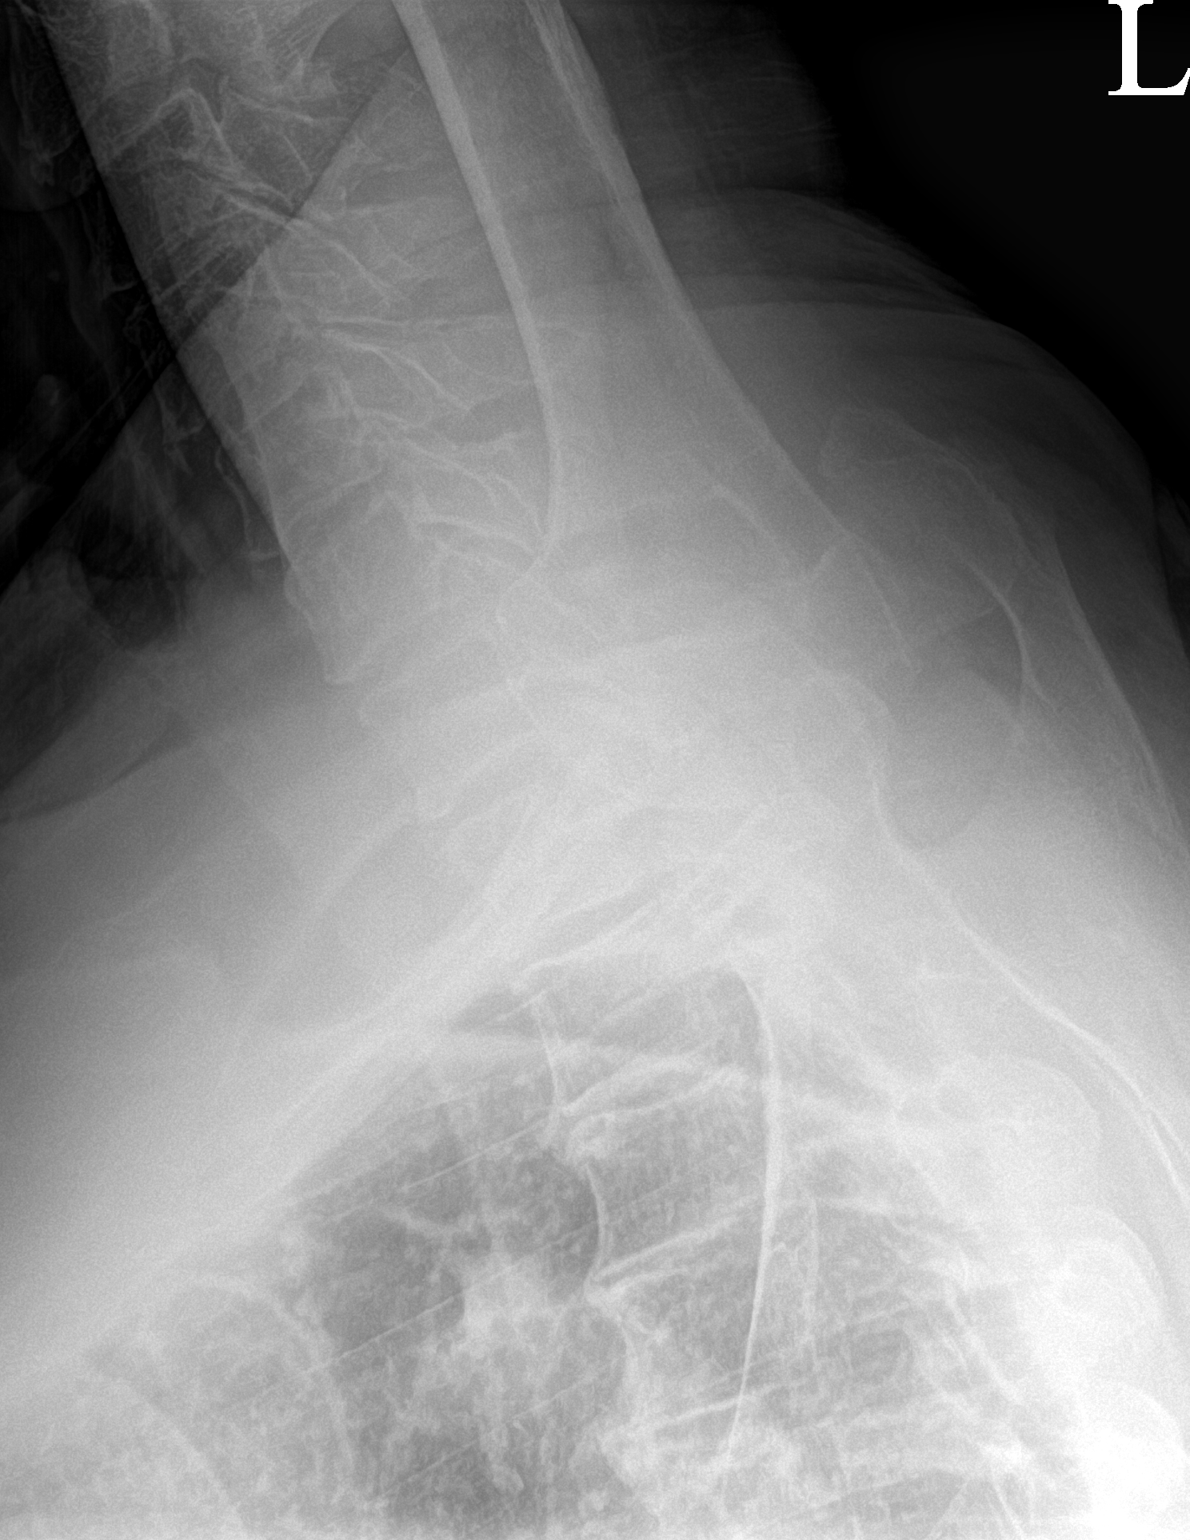

[3 of 3 positions shown; findings below may reference images not displayed]

FINDINGS: Normal alignment. No evidence of fracture. The vertebral body
heights are normal. There is mild multilevel endplate spurring with
minimal disc space narrowing in the midthoracic spine. No evidence
of focal bone abnormality. There is no paravertebral soft tissue
abnormality to suggest fracture.
IMPRESSION: No fracture of the thoracic spine. Mild multilevel degenerative
change.
